# Patient Record
Sex: Male | Born: 1945 | Race: White | Hispanic: No | Smoking: Never smoker
Health system: Southern US, Community
[De-identification: ages and names within clinical notes are randomized; demographics above are authoritative.]

## PROBLEM LIST (undated history)

## (undated) DIAGNOSIS — I509 Heart failure, unspecified: Secondary | ICD-10-CM

## (undated) DIAGNOSIS — I10 Essential (primary) hypertension: Secondary | ICD-10-CM

## (undated) DIAGNOSIS — E785 Hyperlipidemia, unspecified: Secondary | ICD-10-CM

## (undated) DIAGNOSIS — I429 Cardiomyopathy, unspecified: Secondary | ICD-10-CM

## (undated) HISTORY — PX: OTHER SURGICAL HISTORY: SHX169

---

## 1999-07-02 ENCOUNTER — Ambulatory Visit (HOSPITAL_COMMUNITY): Admission: RE | Admit: 1999-07-02 | Discharge: 1999-07-02 | Payer: Self-pay | Admitting: Internal Medicine

## 2013-05-20 ENCOUNTER — Ambulatory Visit: Payer: Self-pay

## 2014-12-31 ENCOUNTER — Inpatient Hospital Stay: Payer: Self-pay | Admitting: Internal Medicine

## 2014-12-31 LAB — CBC
HCT: 51.7 % (ref 40.0–52.0)
HGB: 17.7 g/dL (ref 13.0–18.0)
MCH: 32.4 pg (ref 26.0–34.0)
MCHC: 34.2 g/dL (ref 32.0–36.0)
MCV: 95 fL (ref 80–100)
PLATELETS: 277 10*3/uL (ref 150–440)
RBC: 5.45 10*6/uL (ref 4.40–5.90)
RDW: 12.9 % (ref 11.5–14.5)
WBC: 12.6 10*3/uL — ABNORMAL HIGH (ref 3.8–10.6)

## 2014-12-31 LAB — COMPREHENSIVE METABOLIC PANEL
Albumin: 4.2 g/dL (ref 3.4–5.0)
Alkaline Phosphatase: 85 U/L (ref 46–116)
Anion Gap: 8 (ref 7–16)
BUN: 16 mg/dL (ref 7–18)
Bilirubin,Total: 0.4 mg/dL (ref 0.2–1.0)
Calcium, Total: 9 mg/dL (ref 8.5–10.1)
Chloride: 103 mmol/L (ref 98–107)
Co2: 26 mmol/L (ref 21–32)
Creatinine: 1.24 mg/dL (ref 0.60–1.30)
EGFR (African American): >60
Glucose: 102 mg/dL — ABNORMAL HIGH (ref 65–99)
Osmolality: 275 (ref 275–301)
Potassium: 3.9 mmol/L (ref 3.5–5.1)
SGOT(AST): 41 U/L — ABNORMAL HIGH (ref 15–37)
SGPT (ALT): 46 U/L (ref 14–63)
Sodium: 137 mmol/L (ref 136–145)
TOTAL PROTEIN: 8.1 g/dL (ref 6.4–8.2)

## 2014-12-31 LAB — URINALYSIS, COMPLETE
BACTERIA: NONE SEEN
BILIRUBIN, UR: NEGATIVE
BLOOD: NEGATIVE
GLUCOSE, UR: NEGATIVE mg/dL (ref 0–75)
Hyaline Cast: 9
Ketone: NEGATIVE
Leukocyte Esterase: NEGATIVE
Nitrite: NEGATIVE
Ph: 5 (ref 4.5–8.0)
Protein: 30
RBC,UR: <1 /HPF (ref 0–5)
SQUAMOUS EPITHELIAL: NONE SEEN
Specific Gravity: 1.017 (ref 1.003–1.030)
WBC UR: NONE SEEN /HPF (ref 0–5)

## 2014-12-31 LAB — CK-MB: CK-MB: 15.5 ng/mL — ABNORMAL HIGH (ref 0.5–3.6)

## 2014-12-31 LAB — CK TOTAL AND CKMB (NOT AT ARMC)
CK, Total: 125 U/L (ref 39–308)
CK-MB: 2.1 ng/mL (ref 0.5–3.6)

## 2014-12-31 LAB — TSH: Thyroid Stimulating Horm: 5.21 u[IU]/mL — ABNORMAL HIGH

## 2014-12-31 LAB — TROPONIN I
TROPONIN-I: 2.6 ng/mL — AB
Troponin-I: <0.02 ng/mL

## 2014-12-31 LAB — MAGNESIUM: Magnesium: 2.1 mg/dL

## 2015-01-01 DIAGNOSIS — I499 Cardiac arrhythmia, unspecified: Secondary | ICD-10-CM | POA: Diagnosis not present

## 2015-01-01 DIAGNOSIS — I509 Heart failure, unspecified: Secondary | ICD-10-CM | POA: Diagnosis present

## 2015-01-01 LAB — BASIC METABOLIC PANEL
Anion Gap: 8 (ref 7–16)
BUN: 13 mg/dL (ref 7–18)
CREATININE: 1.02 mg/dL (ref 0.60–1.30)
Calcium, Total: 7.6 mg/dL — ABNORMAL LOW (ref 8.5–10.1)
Chloride: 112 mmol/L — ABNORMAL HIGH (ref 98–107)
Co2: 23 mmol/L (ref 21–32)
EGFR (African American): >60
EGFR (Non-African Amer.): >60
Glucose: 114 mg/dL — ABNORMAL HIGH (ref 65–99)
Osmolality: 286 (ref 275–301)
Potassium: 3.8 mmol/L (ref 3.5–5.1)
Sodium: 143 mmol/L (ref 136–145)

## 2015-01-01 LAB — TROPONIN I
TROPONIN-I: 3.6 ng/mL — AB
Troponin-I: 3 ng/mL — ABNORMAL HIGH

## 2015-01-01 LAB — LIPID PANEL
Cholesterol: 165 mg/dL (ref 0–200)
HDL: 25 mg/dL — AB (ref 40–60)
Ldl Cholesterol, Calc: 114 mg/dL — ABNORMAL HIGH (ref 0–100)
TRIGLYCERIDES: 132 mg/dL (ref 0–200)
VLDL CHOLESTEROL, CALC: 26 mg/dL (ref 5–40)

## 2015-01-01 LAB — MAGNESIUM: Magnesium: 2 mg/dL

## 2015-01-01 LAB — CK-MB
CK-MB: 16.3 ng/mL — ABNORMAL HIGH (ref 0.5–3.6)
CK-MB: 15 ng/mL — ABNORMAL HIGH (ref 0.5–3.6)

## 2015-01-01 LAB — T4, FREE: FREE THYROXINE: 1.11 ng/dL (ref 0.76–1.46)

## 2015-01-02 ENCOUNTER — Ambulatory Visit (HOSPITAL_COMMUNITY)
Admission: AD | Admit: 2015-01-02 | Discharge: 2015-01-02 | Disposition: A | Discharge status: Home / Self Care | Payer: Medicare Other | Source: Other Acute Inpatient Hospital | Attending: Cardiovascular Disease | Admitting: Cardiovascular Disease

## 2015-01-02 DIAGNOSIS — I499 Cardiac arrhythmia, unspecified: Secondary | ICD-10-CM | POA: Insufficient documentation

## 2015-01-02 DIAGNOSIS — I509 Heart failure, unspecified: Secondary | ICD-10-CM | POA: Insufficient documentation

## 2015-03-04 ENCOUNTER — Emergency Department
Admit: 2015-03-04 | Disposition: A | Discharge status: Home / Self Care | Payer: Self-pay | Admitting: Emergency Medicine

## 2015-03-04 LAB — BASIC METABOLIC PANEL
Anion Gap: 8 (ref 7–16)
BUN: 16 mg/dL
CALCIUM: 9.1 mg/dL
CO2: 26 mmol/L
CREATININE: 1.26 mg/dL — AB
Chloride: 102 mmol/L
GFR CALC NON AF AMER: 58 — AB
GLUCOSE: 119 mg/dL — AB
Potassium: 3.8 mmol/L
SODIUM: 136 mmol/L

## 2015-03-04 LAB — CBC WITH DIFFERENTIAL/PLATELET
Basophil #: 0.1 10*3/uL (ref 0.0–0.1)
Basophil %: 0.8 %
EOS PCT: 1.5 %
Eosinophil #: 0.2 10*3/uL (ref 0.0–0.7)
HCT: 45 % (ref 40.0–52.0)
HGB: 15 g/dL (ref 13.0–18.0)
Lymphocyte #: 2.3 10*3/uL (ref 1.0–3.6)
Lymphocyte %: 21.3 %
MCH: 31.3 pg (ref 26.0–34.0)
MCHC: 33.3 g/dL (ref 32.0–36.0)
MCV: 94 fL (ref 80–100)
MONO ABS: 1.1 x10 3/mm — AB (ref 0.2–1.0)
Monocyte %: 10.1 %
NEUTROS PCT: 66.3 %
Neutrophil #: 7 10*3/uL — ABNORMAL HIGH (ref 1.4–6.5)
PLATELETS: 189 10*3/uL (ref 150–440)
RBC: 4.79 10*6/uL (ref 4.40–5.90)
RDW: 13.1 % (ref 11.5–14.5)
WBC: 10.6 10*3/uL (ref 3.8–10.6)

## 2015-03-04 LAB — TROPONIN I: Troponin-I: <0.03 ng/mL

## 2015-04-01 NOTE — Consult Note (Signed)
   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 He has ruled in for mi with troponin of greater than 3. He has been dong well prior to this. He was on metoprolol 100 mg tartrate bid. He is also on lisinopril. He is currently in nsr with no further arryhtmia   Physical Exam:  GEN no acute distress   HEENT PERRL   NECK No masses   RESP clear BS   CARD Regular rate and rhythm   ABD denies tenderness  no Adominal Mass   LYMPH negative neck, negative axillae   EXTR negative cyanosis/clubbing, negative edema   SKIN normal to palpation   NEURO cranial nerves intact, motor/sensory function intact   PSYCH A+O to time, place, person   Review of Systems:  Subjective/Chief Complaint palpiations yesterday but asymptomatic today   General: No Complaints   Skin: No Complaints   ENT: No Complaints   Eyes: No Complaints   Neck: No Complaints   Respiratory: No Complaints   Cardiovascular: chest wall pain   Gastrointestinal: No Complaints   Genitourinary: No Complaints   Vascular: No Complaints   Musculoskeletal: No Complaints   Neurologic: No Complaints   Hematologic: No Complaints   Endocrine: No Complaints   Psychiatric: No Complaints   Review of Systems: All other systems were reviewed and found to be negative   Medications/Allergies Reviewed Medications/Allergies reviewed   Family & Social History:  Family and Social History:  Family History Non-Contributory   EKG:  EKG NSR    No Known Allergies:    Impression 69 yo male with 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 Ruled in for nonstemi. Troponin may be seocndary to cardioversion anxd vt vs  progression of cad. Will need to start on amiodarone and consider cardiac cath to evaluate anatomy to rule out ischemia as etiology of vt. Will need reconsideration for aicd.   Plan 1. Loads with iv amiodarone 2. Rule ouit for mi 3. Consider cardiac cath 4. Will need consideration for aicd 5. Will discuss with his primary cardiologist.   Electronic Signatures: Dalia Heading (MD)  (Signed 01-Feb-16 07:45)  Authored: General Aspect/Present Illness, History and Physical Exam, Review of System, Family & Social History, EKG , Allergies, Impression/Plan   Last Updated: 01-Feb-16 07:45 by Dalia Heading (MD)

## 2015-04-01 NOTE — H&P (Signed)
PATIENT NAME:  Corey Zhang, Corey Zhang MR#:  [id number] DATE OF BIRTH:  02/09/1946  DATE OF ADMISSION:  12/31/2014  PRIMARY CARE PHYSICIAN: Stann Mainland. Sampson Goon, MD   CHIEF COMPLAINT: Chest pain and palpitations today.  HISTORY OF PRESENT ILLNESS: A 69 year old Caucasian male with a history of cardiomyopathy with ejection fraction 25% present the ED with the above chief complaint. The patient is alert, awake, oriented, in no acute distress. The patient said that he was in church and started to have chest pain and palpitation, then he became confused for a short period of time and was sent to the ED for further evaluation. The patient also complains of mild shortness of breath and nausea. The patient has had chest pressure about 2 weeks on and off. The patient was noticed to have tachycardia, was treated with adenosine 3 times (6mg , 6mg , 12mg ). But the patient still had tachycardia. Dr. Scotty Court gave 2 doses of Cardizem IV. Tachycardia did not resolved, but blood pressure decreased to 87/40, so Dr. Scotty Court shocked the patient with 200 joules. The patient's heart rate decreased to 60 to 70s. Dr. Scotty Court ordered a Cardizem drip. The patient denies any chest pain after cardioversion. He denies any cough, sputum, or shortness of breath. No orthopnea or nocturnal dyspnea. No leg edema.   PAST MEDICAL HISTORY: Cardiomyopathy with ejection fraction 25%.   PAST SURGICAL HISTORY: Cardiac stent.    SOCIAL HISTORY: No smoking or drinking or illicit drugs.   FAMILY HISTORY: Hypertension and heart disease.   ALLERGIES: None.   HOME MEDICATIONS: Vitamin B12 at 1000 mcg p.o. daily, red yeast rice 600 mg p.o. daily, multivitamin 1 tablet p.o. daily, Lopressor 100 mg p.o. daily, lisinopril 5 mg p.o. daily, aspirin 325 mg p.o. daily.   REVIEW OF SYSTEMS: CONSTITUTIONAL: The patient denies any fever or chills. No headache, but has dizziness and weakness.  EYES: No double vision or blurry vision.  EARS, NOSE, AND THROAT: No  postnasal drip, slurred speech or dysphagia.  CARDIOVASCULAR: Positive for chest pain, palpitation. No orthopnea or nocturnal dyspnea. No leg edema.  PULMONARY: No cough, sputum, but has mild shortness of breath. No hemoptysis. No wheezing.  GASTROINTESTINAL: No abdominal pain, nausea, vomiting, diarrhea. No melena or bloody stool.  GENITOURINARY: No dysuria, hematuria, or incontinence.  SKIN: No rash or jaundice.  NEUROLOGY: No syncope, loss of consciousness, or seizure, but has temporary confusion. HEPATOLOGY: No easy bruising or bleeding.  ENDOCRINOLOGY: No polyuria, polydipsia, heat or cold intolerance.   PHYSICAL EXAMINATION:  VITAL SIGNS: Temperature 98.3, blood pressure 117/75, pulse 67, oxygen saturation 96% on room air.  GENERAL: The patient is alert, awake, oriented, in no acute distress.  HEENT: Pupils round, equal and reactive to light and accommodation. Moist oral mucosa. Clear oropharynx.  NECK: Supple. No JVD or carotid bruits. No lymphadenopathy. No thyromegaly.  CARDIOVASCULAR: S1 and S2, regular rate, rhythm. No murmurs or gallops.  PULMONARY: Bilateral air entry. No wheezing or rales. No use of accessory muscles to breathe.  ABDOMEN: Soft, obese. No distention or tenderness. No organomegaly. Bowel sounds present.  EXTREMITIES: No edema, clubbing or cyanosis. No calf tenderness. Bilateral pedal pulses present.  SKIN: No rash or jaundice.  NEUROLOGY: A and O x 3. No focal deficit. Power 5/5. Sensory intact.   LABORATORY DATA: Chest x-ray did not show any edema or consolidation. Glucose 102, BUN 6, creatinine 1.24. Electrolytes normal. CBC showed WBC 12.6, hemoglobin 17.7, platelets 277,000. TSH 5.21. Troponin less than 0.02. Urinalysis is negative. EKG initially showed  wide QRS, tachycardia at 221; after cardioversion it decreased to 67 with a mixed arrhythmia.   IMPRESSIONS:  1.  Ventricular tachycardia.  2.  Hypotension.  3.  Cardiomyopathy with ejection fraction 25%.    PLAN OF TREATMENT: The patient will be admitted to step-down unit. Closely monitor the patient's vital signs on the telemetry monitor and follow up troponin. Discussed with Dr. Lady Gary. He suggested no Cardizem drip at this time and continue the patient's home medication Lopressor 100 mg p.o. daily. In addition, he agrees to start Lovenox 1 mg/kg q. 12 hours. He will see the patient. I will continue aspirin and lisinopril. I discussed the patient's condition and plan of treatment with the patient. The patient wants DNR status, discussed with Dr. Lady Gary.   CRITICAL TIME SPENT: About 62 minutes.    ____________________________ Shaune Pollack, MD qc:TT D: 12/31/2014 20:50:06 ET T: 12/31/2014 21:27:26 ET JOB#: [id number]  cc: Shaune Pollack, MD, <Dictator> Shaune Pollack MD ELECTRONICALLY SIGNED 01/01/2015 15:30

## 2015-04-01 NOTE — Discharge Summary (Signed)
PATIENT NAME:  Corey Zhang, Corey Zhang MR#:  [id number] DATE OF BIRTH:  [birth year]  DATE OF ADMISSION:  12/31/2014 DATE OF DISCHARGE:  01/01/2015  DISCHARGE DIAGNOSES: 1.  Ventricular tachycardia.  2.  Hypotension.  3. Cardiomyopathy with ejection fraction of 25% requiring automatic implantable cardiac defibrillator.   REASON FOR TRANSFER:  Need for automatic implantable cardiac defibrillator for severe cardiomyopathy.   SECONDARY DIAGNOSIS:  Cardiomyopathy.   CONSULTATION: Cardiology, Darlin Priestly. Lady Gary, MD  PROCEDURES AND RADIOLOGY: Cardiac catheterization on February 1 showed no significant coronary disease. EF was 20%.   Chest x-ray showed no acute cardiopulmonary disease.   MAJOR LABORATORY PANEL: UA on admission was negative.   HISTORY AND SHORT HOSPITAL COURSE: The patient is a 68 year old male with above-mentioned medical problems who was admitted for chest pain and palpitation and was found to have ventricular tachycardia in the Emergency Department. Was shocked and started on Cardizem drip.  Subsequently, cardiology consultation was obtained. The patient was admitted to ICU. The patient was switched to amiodarone drip, rate was much better controlled. The patient was taken for cardiac catheterization this morning showing no significant coronary disease, but considering severe cardiomyopathy with EF of 20%, he is being transferred to [facility] for AICD placement.   MEDICATIONS WHILE IN THE HOSPITAL:  1.  Amiodarone drip. 2.  Tylenol 650 mg p.o. every 4 hours as needed.  3.  Norco 5/325 mg 1-2 tablets every 4 hours as needed.  4.  Aspirin 325 mg p.o. daily.  5.  Colace 100 mg p.o. b.i.d. as needed.  6.  Lovenox 75 mg subcutaneous b.i.d.  7.  Lisinopril 5 mg p.o. daily.  8.  Zofran 4 mg IV every 4 hours as needed.  9.  Senna 1 tablet p.o. b.i.d.  10.  Fentanyl 50 mcg IV every 1-2 minutes as needed.  11.  Metoprolol 50 mg p.o. daily.   TOTAL TIME DISCHARGING THIS  PATIENT: 45 minutes (critical care).   The patient will need to go on amiodarone drip on the transfer ambulance.    ____________________________ Cherysh Epperly S. Sherryll Burger, MD vss:LT D: 01/01/2015 17:05:57 ET T: 01/01/2015 17:25:07 ET JOB#: [id number]  cc: Velna Hedgecock S. Sherryll Burger, MD, <Dictator> Darlin Priestly. Lady Gary, MD Stann Mainland. Sampson Goon, MD Rise Paganini. Al-Khatib, MD, [facility] [place] Sia [facility] MD ELECTRONICALLY SIGNED 01/02/2015 10:09

## 2016-01-05 IMAGING — CR DG CHEST 1V PORT
1 series · 1 of 1 positions shown · non-contrast
Comparison: None.

CLINICAL DATA: Sternal region chest pain, acute

EXAM:
PORTABLE CHEST - 1 VIEW

[ap]
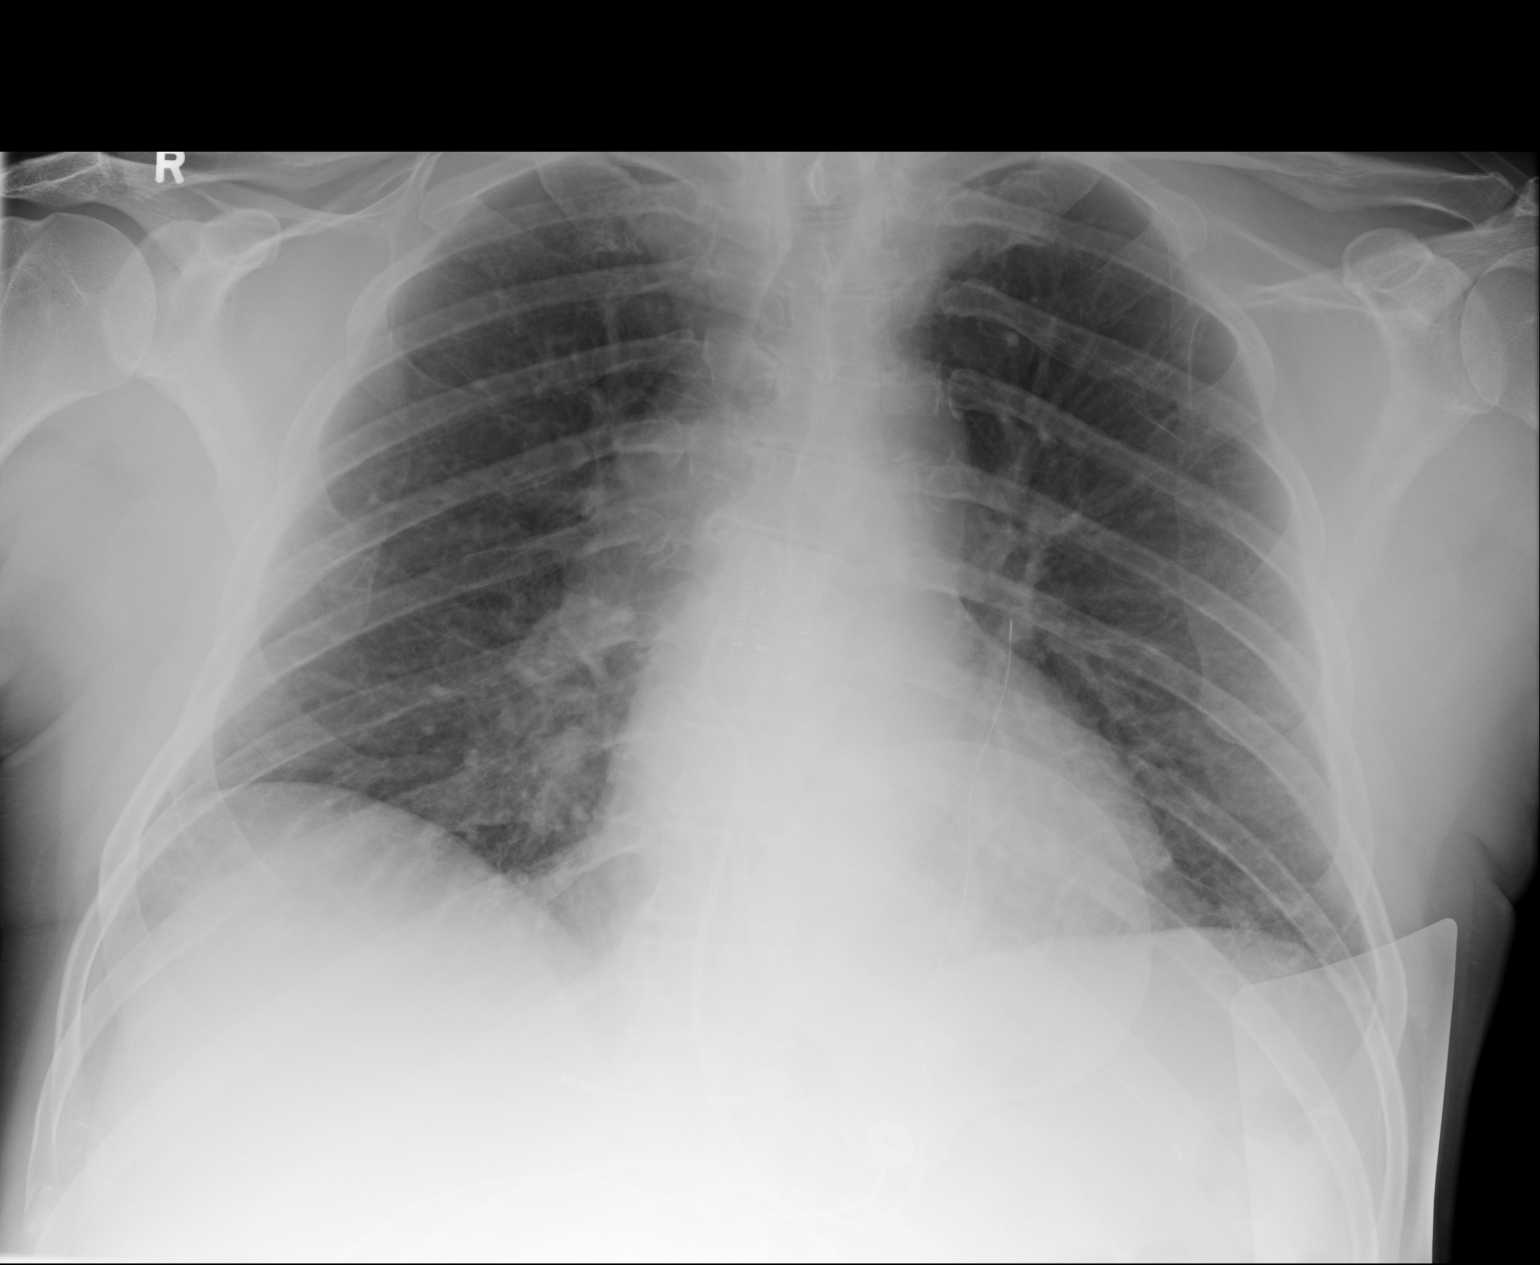

[1 of 1 positions shown; findings below may reference images not displayed]

FINDINGS: The lungs are clear. Heart size and pulmonary vascularity are
normal. No adenopathy. No pneumothorax. No bone lesions. There is
upper thoracic levoscoliosis.
IMPRESSION: No edema or consolidation.

## 2016-03-08 IMAGING — CT CT ANGIO HEAD-NECK
2 of 8 series · 8 of 33 positions shown · IV contrast (omnipaque)
Comparison: Head CT ear earlier same day

CLINICAL DATA: Vertigo with standing. Vertebrobasilar
insufficiency.

EXAM:
CT ANGIOGRAPHY HEAD AND NECK
TECHNIQUE: Multidetector CT imaging of the head and neck was performed using
the standard protocol during bolus administration of intravenous
contrast. Multiplanar CT image reconstructions and MIPs were
obtained to evaluate the vascular anatomy. Carotid stenosis
measurements (when applicable) are obtained utilizing NASCET
criteria, using the distal internal carotid diameter as the
denominator.
CONTRAST:  80 cc Omnipaque 350

[Series 4: cta head · axial · 0.46mm/px · z∈[-278,-76]mm · 4 of 338 slices shown]
[im 68/338  soft-tissue]
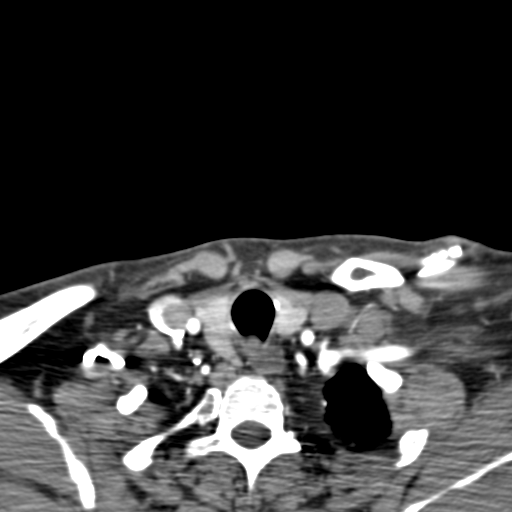
[im 135/338  bone]
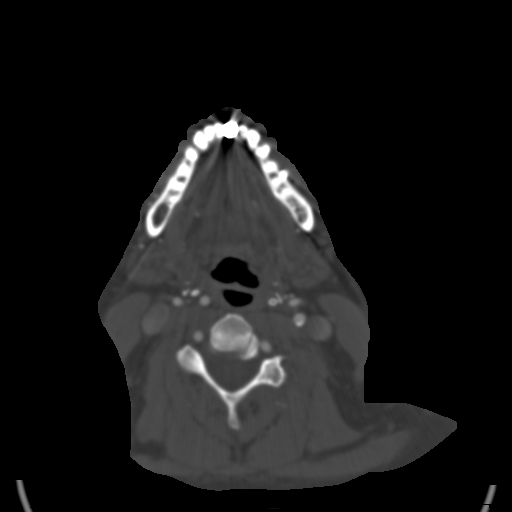
[im 203/338  soft-tissue]
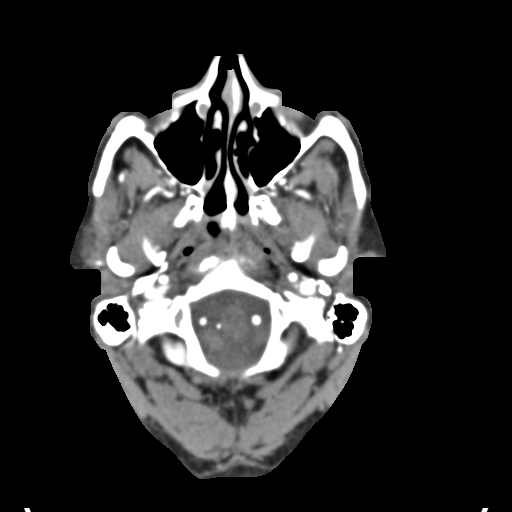
[im 270/338  bone]
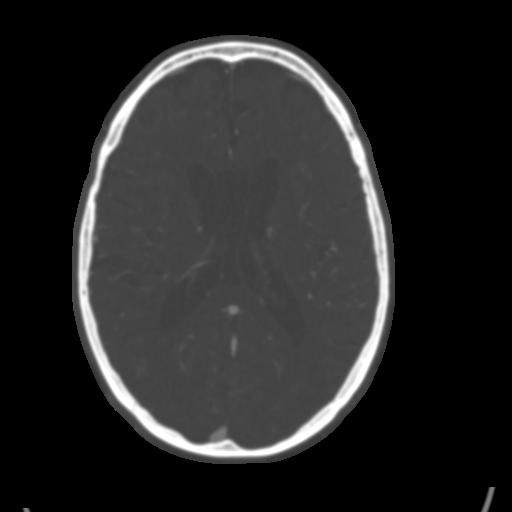

[Series 5: ax thin · axial · 0.36mm/px · z∈[-287,-87]mm · 4 of 336 slices shown]
[im 68/336  soft-tissue]
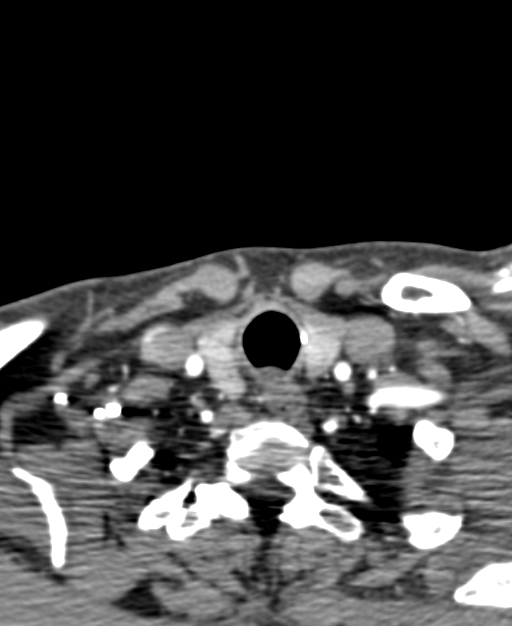
[im 135/336  soft-tissue]
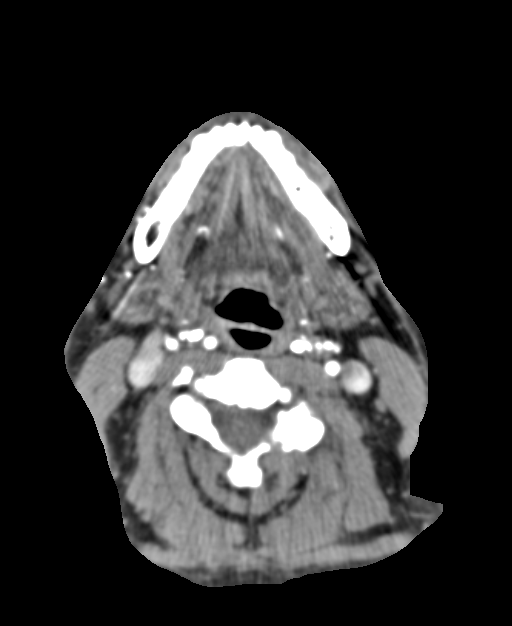
[im 202/336  soft-tissue]
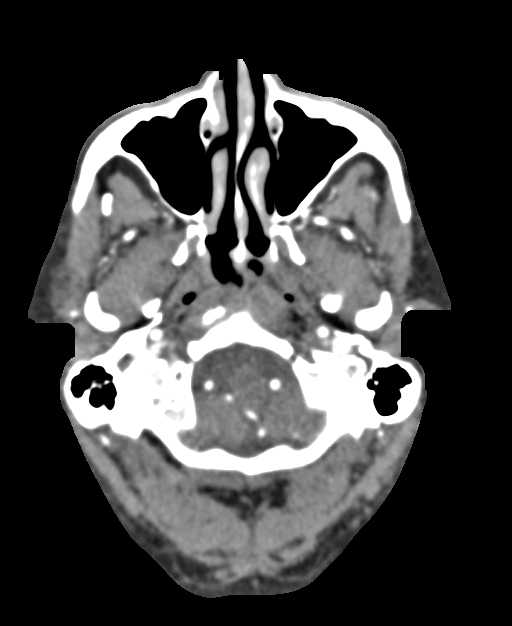
[im 269/336  soft-tissue]
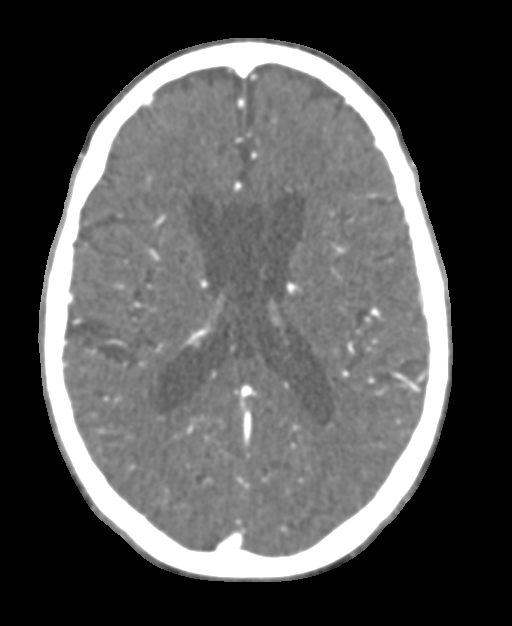

[8 of 33 positions shown; findings below may reference images not displayed]

FINDINGS: CTA NECK

Aortic arch: Mild atherosclerosis of the arch without aneurysm or
dissection. Branching pattern of brachiocephalic vessels is normal
with wide patency at the origins.

Right carotid system: Right common carotid artery is widely patent
to the bifurcation. There is atherosclerosis of the carotid
bifurcation but no narrowing of the internal carotid artery compared
to the more distal cervical internal carotid artery and therefore no
stenosis.

Left carotid system: Common carotid artery widely patent to the
bifurcation region. Mild atherosclerosis of the carotid bifurcation.
Minimal ICA diameter is 3.3 mm. Compared to a more distal cervical
ICA diameter of 4 mm, this indicates 20% stenosis.

Vertebral arteries:Both vertebral artery origins are widely patent.
Both vertebral arteries are widely patent through the cervical
region without stenosis or dissection.

Skeleton: Ordinary mid cervical spondylosis.

Other neck: No mass or lymphadenopathy.

CTA HEAD

Anterior circulation: Both internal carotid arteries are widely
patent through the skullbase and siphon region. The anterior and
middle cerebral vessels are patent without proximal stenosis,
aneurysm or vascular malformation.

Posterior circulation: Both vertebral arteries are widely patent
through the foramen magnum. Both vertebral arteries supply the
basilar. No basilar stenosis. Posterior circulation branch vessels
are normal.

Venous sinuses: Normal and patent.

Anatomic variants: None

Delayed phase: No significant finding
IMPRESSION: Essentially normal CT angiography of the neck and head for a person
of this age. Mild atherosclerotic change at both carotid
bifurcations. No stenosis on the right. 20% stenosis on the left.

No vertebrobasilar vascular pathology.

## 2020-05-11 ENCOUNTER — Emergency Department
Admission: EM | Admit: 2020-05-11 | Discharge: 2020-05-11 | Disposition: A | Discharge status: Home / Self Care | Payer: Medicare Other | Attending: Student | Admitting: Student

## 2020-05-11 ENCOUNTER — Other Ambulatory Visit: Payer: Self-pay

## 2020-05-11 DIAGNOSIS — I110 Hypertensive heart disease with heart failure: Secondary | ICD-10-CM | POA: Diagnosis not present

## 2020-05-11 DIAGNOSIS — I509 Heart failure, unspecified: Secondary | ICD-10-CM | POA: Insufficient documentation

## 2020-05-11 DIAGNOSIS — Z7982 Long term (current) use of aspirin: Secondary | ICD-10-CM | POA: Diagnosis not present

## 2020-05-11 DIAGNOSIS — Z79899 Other long term (current) drug therapy: Secondary | ICD-10-CM | POA: Diagnosis not present

## 2020-05-11 DIAGNOSIS — R040 Epistaxis: Secondary | ICD-10-CM | POA: Diagnosis not present

## 2020-05-11 DIAGNOSIS — Z95811 Presence of heart assist device: Secondary | ICD-10-CM | POA: Insufficient documentation

## 2020-05-11 HISTORY — DX: Hyperlipidemia, unspecified: E78.5

## 2020-05-11 HISTORY — DX: Heart failure, unspecified: I50.9

## 2020-05-11 HISTORY — DX: Essential (primary) hypertension: I10

## 2020-05-11 HISTORY — DX: Cardiomyopathy, unspecified: I42.9

## 2020-05-11 LAB — BASIC METABOLIC PANEL
Anion gap: 9 (ref 5–15)
BUN: 14 mg/dL (ref 8–23)
CO2: 22 mmol/L (ref 22–32)
Calcium: 8.8 mg/dL — ABNORMAL LOW (ref 8.9–10.3)
Chloride: 106 mmol/L (ref 98–111)
Creatinine, Ser: 1.06 mg/dL (ref 0.61–1.24)
GFR calc Af Amer: >60 mL/min (ref >60)
GFR calc non Af Amer: >60 mL/min (ref >60)
Glucose, Bld: 99 mg/dL (ref 70–99)
Potassium: 4.5 mmol/L (ref 3.5–5.1)
Sodium: 137 mmol/L (ref 135–145)

## 2020-05-11 LAB — CBC
HCT: 45.5 % (ref 39.0–52.0)
Hemoglobin: 15.5 g/dL (ref 13.0–17.0)
MCH: 31.9 pg (ref 26.0–34.0)
MCHC: 34.1 g/dL (ref 30.0–36.0)
MCV: 93.6 fL (ref 80.0–100.0)
Platelets: 144 10*3/uL — ABNORMAL LOW (ref 150–400)
RBC: 4.86 MIL/uL (ref 4.22–5.81)
RDW: 12.7 % (ref 11.5–15.5)
WBC: 9 10*3/uL (ref 4.0–10.5)
nRBC: 0 % (ref 0.0–0.2)

## 2020-05-11 NOTE — ED Provider Notes (Signed)
[facility] Emergency Department Provider Note  ____________________________________________   First MD Initiated Contact with Patient 05/11/20 [phone]     (approximate)  I have reviewed the triage vital signs and the nursing notes.  History  Chief Complaint Epistaxis    HPI Corey Zhang is a 74 y.o. male PMHx as below who presents for nose bleed.  Patient states he first developed a mild nosebleed around 3 AM.  This lasted for about 45 minutes before he was able to get it to stop.  Then around 6 AM he began to experience a nosebleed again, but this time he felt like it was worse than prior, more brisk.  He feels it coming from the left side of his nose.  He was unable to get this nosebleed to stop on his own, therefore called EMS.  Reports a history of about yearly nosebleeds, especially when his nose gets dry, as it has been recently.  Also thinks he might of scratched his nose with his finger.  He is on a baby aspirin daily, no other antiplatelets or antithinners.  By arrival to the emergency department his nosebleed has stopped.  Denies any pain.   Past Medical Hx Past Medical History:  Diagnosis Date  . Cardiomyopathy (HCC)   . CHF (congestive heart failure) (HCC)   . Hyperlipidemia   . Hypertension     Problem List There are no problems to display for this patient.   Past Surgical Hx Past Surgical History:  Procedure Laterality Date  . defribrillator implant      Medications Per chart review,  ASA 81 mg QD Lisinopril Melatonin Metoprolol Vitamin  Allergies Patient has no allergy information on record.  Family Hx No family history on file.  Social Hx Social History   Tobacco Use  . Smoking status: Not on file  Substance Use Topics  . Alcohol use: Not on file  . Drug use: Not on file     Review of Systems  Constitutional: Negative for fever. Negative for chills. Eyes: Negative for visual changes. ENT: +  nosebleed Cardiovascular: Negative for chest pain. Respiratory: Negative for shortness of breath. Gastrointestinal: Negative for nausea. Negative for vomiting.  Genitourinary: Negative for dysuria. Musculoskeletal: Negative for leg swelling. Skin: Negative for rash. Neurological: Negative for headaches.   Physical Exam  Vital Signs: ED Triage Vitals  Enc Vitals Group     BP 05/11/20 0723 (!) 125/92     Pulse Rate 05/11/20 0723 91     Resp 05/11/20 0723 18     Temp 05/11/20 0723 98.9 F (37.2 C)     Temp Source 05/11/20 0723 Oral     SpO2 05/11/20 0723 95 %     Weight --      Height --      Head Circumference --      Peak Flow --      Pain Score 05/11/20 0722 0     Pain Loc --      Pain Edu? --      Excl. in GC? --     Constitutional: Alert and oriented. Well appearing. NAD.  Head: Normocephalic. Atraumatic. Eyes: Conjunctivae clear. Sclera anicteric. Pupils equal and symmetric. Nose: Arrives with nose clamp in place.  Upon removal of the nose clamp, small amount of dried epistaxis visualized in bilateral naris.  No active bleeding.  No obvious vessel source identified. Mouth/Throat: Wearing mask.  No blood in the posterior oropharynx. Neck: No stridor. Trachea midline.  Cardiovascular: Normal rate. Extremities well perfused. Respiratory: Normal respiratory effort.   Genitourinary: Deferred. Musculoskeletal: No lower extremity edema. No deformities. Neurologic:  Normal speech and language. No gross focal or lateralizing neurologic deficits are appreciated.  Skin: Skin is warm, dry and intact. No rash noted. Psychiatric: Mood and affect are appropriate for situation.   Procedures  Procedure(s) performed (including critical care):  Procedures   Initial Impression / Assessment and Plan / MDM / ED Course  74 y.o. male who presents to the ED for recurrent episode of epistaxis.  Had an episode at home this morning around 3 AM which resolved on its own, then recurred  around 6 AM prompting him to seek care.  By arrival to the emergency department his nosebleed has again resolved.  Takes baby aspirin daily, no other thinners.  Exam as above.  We will plan to observe for at least 30 minutes to ensure continued cessation of his bleeding.  If remains stable, will plan for discharge with supportive care, discussed nasal precautions, and plan for ENT follow-up.  9:03 AM Patient has been observed for greater than 1 hour without recurrence in his epistaxis.  Basic blood work obtained in triage reveal normal hemoglobin, minimally decreased platelets.  As such, feel patient is stable for discharge with outpatient follow-up.  Discussed nasal precautions.  Advise ENT follow-up and given return precautions.  Patient voices understanding and is comfortable with the plan and discharge.  _______________________________   As part of my medical decision making I have reviewed available labs, radiology tests, reviewed old records/performed chart review.    Final Clinical Impression(s) / ED Diagnosis  Final diagnoses:  Epistaxis       Note:  This document was prepared using Dragon voice recognition software and may include unintentional dictation errors.   Lilia Pro., MD 05/11/20 [phone]

## 2020-05-11 NOTE — ED Triage Notes (Addendum)
Pt comes via [facility] from home with c/o nose bleed for 3 hours.  Pt comes with clamp in place with some bleeding. Pt states he takes daily aspirin.  Pt denies any pain or other complaints.   Pt does state nose bleeds in past but never this bad and unable to stop. Pt spitting up clots of blood at this time in triage.

## 2020-05-11 NOTE — ED Notes (Signed)
Pt with c/o of epistaxis. Nose clamp removed by MD and bleeding controlled at this time. Pt denies any pain or other symptoms.

## 2020-05-11 NOTE — ED Notes (Signed)
Pt verbalized understanding of discharge instructions. NAD at this time. 

## 2020-05-11 NOTE — Discharge Instructions (Signed)
Thank you for letting us take care of you in the ER today.  As best you can, for the next 48 hours, avoid anything in the nose, including fingers/Q-tips/etc.  Avoid blowing your nose, coughing, sneezing, clearing of your throat as this can dislodge the scab that has helped stop your bleeding.  After this, you can apply a small amount of Vaseline to your nose when it is feeling dry.  Follow-up with the ENT doctors in the clinic as needed.  Return to the emergency department for any new or worsening symptoms.

## 2020-08-07 ENCOUNTER — Inpatient Hospital Stay
Admission: EM | Admit: 2020-08-07 | Discharge: 2020-08-09 | DRG: 309 | Disposition: A | Discharge status: Other Acute Inpatient Hospital | Payer: Medicare Other | Attending: Internal Medicine | Admitting: Internal Medicine

## 2020-08-07 ENCOUNTER — Encounter: Payer: Self-pay | Admitting: Psychiatry

## 2020-08-07 ENCOUNTER — Other Ambulatory Visit: Payer: Self-pay

## 2020-08-07 DIAGNOSIS — I499 Cardiac arrhythmia, unspecified: Secondary | ICD-10-CM | POA: Diagnosis not present

## 2020-08-07 DIAGNOSIS — Z8249 Family history of ischemic heart disease and other diseases of the circulatory system: Secondary | ICD-10-CM

## 2020-08-07 DIAGNOSIS — I420 Dilated cardiomyopathy: Secondary | ICD-10-CM | POA: Diagnosis present

## 2020-08-07 DIAGNOSIS — Z823 Family history of stroke: Secondary | ICD-10-CM | POA: Diagnosis not present

## 2020-08-07 DIAGNOSIS — Z7982 Long term (current) use of aspirin: Secondary | ICD-10-CM

## 2020-08-07 DIAGNOSIS — I110 Hypertensive heart disease with heart failure: Secondary | ICD-10-CM | POA: Diagnosis present

## 2020-08-07 DIAGNOSIS — E785 Hyperlipidemia, unspecified: Secondary | ICD-10-CM | POA: Diagnosis present

## 2020-08-07 DIAGNOSIS — Z808 Family history of malignant neoplasm of other organs or systems: Secondary | ICD-10-CM | POA: Diagnosis not present

## 2020-08-07 DIAGNOSIS — Z20822 Contact with and (suspected) exposure to covid-19: Secondary | ICD-10-CM | POA: Diagnosis present

## 2020-08-07 DIAGNOSIS — Z79899 Other long term (current) drug therapy: Secondary | ICD-10-CM

## 2020-08-07 DIAGNOSIS — I4901 Ventricular fibrillation: Principal | ICD-10-CM

## 2020-08-07 DIAGNOSIS — I472 Ventricular tachycardia: Secondary | ICD-10-CM | POA: Diagnosis present

## 2020-08-07 DIAGNOSIS — Z8673 Personal history of transient ischemic attack (TIA), and cerebral infarction without residual deficits: Secondary | ICD-10-CM

## 2020-08-07 DIAGNOSIS — Z8349 Family history of other endocrine, nutritional and metabolic diseases: Secondary | ICD-10-CM

## 2020-08-07 DIAGNOSIS — I5022 Chronic systolic (congestive) heart failure: Secondary | ICD-10-CM | POA: Diagnosis present

## 2020-08-07 DIAGNOSIS — Z95810 Presence of automatic (implantable) cardiac defibrillator: Secondary | ICD-10-CM | POA: Diagnosis not present

## 2020-08-07 DIAGNOSIS — Z83438 Family history of other disorder of lipoprotein metabolism and other lipidemia: Secondary | ICD-10-CM | POA: Diagnosis not present

## 2020-08-07 DIAGNOSIS — Z888 Allergy status to other drugs, medicaments and biological substances status: Secondary | ICD-10-CM

## 2020-08-07 LAB — CBC
HCT: 45.6 % (ref 39.0–52.0)
Hemoglobin: 16 g/dL (ref 13.0–17.0)
MCH: 33 pg (ref 26.0–34.0)
MCHC: 35.1 g/dL (ref 30.0–36.0)
MCV: 94 fL (ref 80.0–100.0)
Platelets: 191 10*3/uL (ref 150–400)
RBC: 4.85 MIL/uL (ref 4.22–5.81)
RDW: 12.5 % (ref 11.5–15.5)
WBC: 12.6 10*3/uL — ABNORMAL HIGH (ref 4.0–10.5)
nRBC: 0 % (ref 0.0–0.2)

## 2020-08-07 LAB — SARS CORONAVIRUS 2 BY RT PCR (HOSPITAL ORDER, PERFORMED IN ~~LOC~~ HOSPITAL LAB): SARS Coronavirus 2: NEGATIVE

## 2020-08-07 LAB — BASIC METABOLIC PANEL
Anion gap: 12 (ref 5–15)
BUN: 12 mg/dL (ref 8–23)
CO2: 23 mmol/L (ref 22–32)
Calcium: 9.2 mg/dL (ref 8.9–10.3)
Chloride: 103 mmol/L (ref 98–111)
Creatinine, Ser: 0.96 mg/dL (ref 0.61–1.24)
GFR calc Af Amer: >60 mL/min (ref >60)
GFR calc non Af Amer: >60 mL/min (ref >60)
Glucose, Bld: 95 mg/dL (ref 70–99)
Potassium: 4.2 mmol/L (ref 3.5–5.1)
Sodium: 138 mmol/L (ref 135–145)

## 2020-08-07 LAB — TROPONIN I (HIGH SENSITIVITY)
Troponin I (High Sensitivity): 154 ng/L (ref <18)
Troponin I (High Sensitivity): 249 ng/L (ref <18)
Troponin I (High Sensitivity): 222 ng/L (ref <18)

## 2020-08-07 LAB — PROTIME-INR
INR: 1 (ref 0.8–1.2)
Prothrombin Time: 12.4 seconds (ref 11.4–15.2)

## 2020-08-07 LAB — MAGNESIUM: Magnesium: 2.2 mg/dL (ref 1.7–2.4)

## 2020-08-07 MED ORDER — ENOXAPARIN SODIUM 40 MG/0.4ML ~~LOC~~ SOLN
40.0000 mg | SUBCUTANEOUS | Status: DC
  Administered 2020-08-07 – 2020-08-08 (×2): 40 mg via SUBCUTANEOUS
  Filled 2020-08-07 (×2): qty 0.4

## 2020-08-07 MED ORDER — ALUM & MAG HYDROXIDE-SIMETH 200-200-20 MG/5ML PO SUSP: 15.0000 mL | Freq: Four times a day (QID) | ORAL | Status: DC | PRN

## 2020-08-07 MED ORDER — MELATONIN 5 MG PO TABS
5.0000 mg | ORAL_TABLET | Freq: Every evening | ORAL | Status: DC | PRN
  Filled 2020-08-07: qty 1

## 2020-08-07 MED ORDER — CALCIUM CARBONATE ANTACID 500 MG PO CHEW: 1.0000 | CHEWABLE_TABLET | Freq: Three times a day (TID) | ORAL | Status: DC | PRN

## 2020-08-07 MED ORDER — ONDANSETRON 4 MG PO TBDP
4.0000 mg | ORAL_TABLET | Freq: Three times a day (TID) | ORAL | Status: DC | PRN
  Filled 2020-08-07: qty 1

## 2020-08-07 MED ORDER — ACETAMINOPHEN 500 MG PO TABS: 1000.0000 mg | ORAL_TABLET | Freq: Three times a day (TID) | ORAL | Status: DC | PRN

## 2020-08-07 MED ORDER — DOCUSATE SODIUM 100 MG PO CAPS: 100.0000 mg | ORAL_CAPSULE | Freq: Two times a day (BID) | ORAL | Status: DC | PRN

## 2020-08-07 MED ORDER — LISINOPRIL 5 MG PO TABS
5.0000 mg | ORAL_TABLET | Freq: Every day | ORAL | Status: DC
  Administered 2020-08-08 – 2020-08-09 (×2): 5 mg via ORAL
  Filled 2020-08-07 (×2): qty 1

## 2020-08-07 MED ORDER — POLYETHYLENE GLYCOL 3350 17 G PO PACK: 17.0000 g | PACK | Freq: Two times a day (BID) | ORAL | Status: DC | PRN

## 2020-08-07 MED ORDER — METOPROLOL SUCCINATE ER 100 MG PO TB24
100.0000 mg | ORAL_TABLET | Freq: Two times a day (BID) | ORAL | Status: DC
  Administered 2020-08-07 – 2020-08-09 (×4): 100 mg via ORAL
  Filled 2020-08-07 (×2): qty 1
  Filled 2020-08-07: qty 2
  Filled 2020-08-07: qty 1

## 2020-08-07 MED ORDER — ASPIRIN EC 81 MG PO TBEC
81.0000 mg | DELAYED_RELEASE_TABLET | Freq: Every day | ORAL | Status: DC
  Administered 2020-08-08 – 2020-08-09 (×2): 81 mg via ORAL
  Filled 2020-08-07 (×3): qty 1

## 2020-08-07 MED ORDER — GUAIFENESIN-DM 100-10 MG/5ML PO SYRP
10.0000 mL | ORAL_SOLUTION | Freq: Four times a day (QID) | ORAL | Status: DC | PRN
  Filled 2020-08-07: qty 10

## 2020-08-07 MED ORDER — ONDANSETRON HCL 4 MG/2ML IJ SOLN: 4.0000 mg | Freq: Four times a day (QID) | INTRAMUSCULAR | Status: DC | PRN

## 2020-08-07 NOTE — ED Notes (Signed)
Pt stated he has urinated 3 times and had 1BM since being here.

## 2020-08-07 NOTE — ED Triage Notes (Signed)
Patient arrived via EMS from home and patient was in his garage and his internal defibrillator fired once about 1050 and he fell down, and then got up and the defibrillator fired again about 10 minutes later, which scared him because it has never gone off while he was walking. Defibrillator was placed in 2016 due ventricular tachycardia. Patient denies hitting his head when he fell and no LOC.  Per EMS patient was A&Ox4, frequent PVCs. Dr. Marliss Czar is his cardiologist. VSS NAD noted. Patient says he is having a weird feeling in his head.

## 2020-08-07 NOTE — ED Notes (Signed)
Pt states coming in because earlier today his pacemaker/defibrillator went off 2 times in 15 minutes. Pt states he was walking and he felt it go off and made him go to the ground. Pt states it has not gone off since coming in to the ER. Pt denies chest pain at this time.

## 2020-08-07 NOTE — ED Notes (Addendum)
Informed Dr. Fran Lowes that patient had 6 beat run of Vtach and she informed me that patient is being transferred to [facility] which was arranged by cardiology, per Dr. Gwen Pounds, "EMTALA and transfer information has been already performed so we will just wait for the bed and otherwise take care of the patient until that occurs." If pt still in the ED when you get a call about transport coming to get the pt, pls just message the oncall hospitalist provider to update the EMTALA.

## 2020-08-07 NOTE — H&P (Signed)
History and Physical    [name]:[id number] DOB: October 12, 1946 DOA: 08/07/2020  PCP: Lynnea Ferrier, MD  Patient coming from: home  I have personally briefly reviewed patient's old medical records in [facility]  Chief Complaint: ICD shocked twice  HPI: Corey Zhang is a 74 y.o. Caucasian male with medical history significant of nonischemic dilated cardiomyopathy with ICD, systolic CHF, VT, CVA, who presented after his ICD fired at home twice today.  Pt was in his usual state of health and walking around in his garage when his ICD fired and caused him to drop to the ground.  Did not hit his head.  Prior to the firing, pt did feel "strange" in his head.  After he rested on the ground for about 10 minutes, he got up and got shocked again, so he decided he better call EMS since he lives alone.  No recent illness.   No chest pain or dyspnea.    Pt follows with Dr. Darrold Junker at [facility].  Pt had had recent shocks on 01/12/2020 and 02/01/2020. Interrogation revealed appropriate shock therapy for ventricular tachycardia.  Pt's metop was increased to 100 mg BID from 75 mg BID.  Pt used to take amiodarone which caused thyroid and liver toxicities, so it was discontinued.     ED Course: initial vitals: afebrile, pulse 90, BP 123/93, sating 99% on room air.  Labs notable for normal electrolytes, trop 154.  EKG showed sinus rhythm.  ED provider discussed case with cardiology Dr. Gwen Pounds.  ICD interrogation showed VFib.  Dr. Gwen Pounds discussed with cardiology at [facility] who has accepted pt for transfer.  Pt is admitted to cardiac progressive care unit until the bed becomes available in [facility] for transfer.   Assessment/Plan Active Problems:   Ventricular fibrillation (HCC)   # VFib with ICD shocks --happened twice at home today.  Pt has had prior ICD firings for VTach.  Follows with Dr. Darrold Junker at [facility].  Takes metop 100 BID.  Can not tolerate amiodarone due to organ toxicities.   --Dr. Gwen Pounds  consulted in the ED and had arranged for pt to be transferred to [facility]. PLAN: --Admit to cardiac progressive care unit --continue home ASA, metop 100mg  BID and Lisinopril. --Transfer to [facility] when bed becomes available.  Call oncall hospitalist to update EMTALA when pt is called by transport.   DVT prophylaxis: Lovenox SQ Code Status: Full code  Family Communication:   Disposition Plan: transfer to [facility] when bed available  Consults called: cardiology Admission status: Inpatient   Review of Systems: As per HPI otherwise 10 point review of systems negative.   Past Medical History:  Diagnosis Date  . Cardiomyopathy (HCC)   . CHF (congestive heart failure) (HCC)   . Hyperlipidemia   . Hypertension     Past Surgical History:  Procedure Laterality Date  . defribrillator implant       has no history on file for tobacco use, alcohol use, and drug use.  Allergies  Allergen Reactions  . Amiodarone     Hurts liver    Family History  Problem Relation Age of Onset  . Skin cancer Mother   . Thyroid disease Mother   . CAD Father   . Stroke Father   . Alcohol abuse Father   . CAD Brother   . Heart attack Brother   . Hyperlipidemia Brother      Prior to Admission medications   Medication Sig Start Date End Date Taking? Authorizing  Provider  acetaminophen (TYLENOL) 500 MG tablet Take 1,000 mg by mouth as needed.    [provider]  aspirin 81 MG EC tablet Take 81 mg by mouth daily.    [provider]  lisinopril (ZESTRIL) 10 MG tablet Take 5 mg by mouth daily. 06/05/20   [provider]  melatonin 5 MG TABS Take 5 mg by mouth at bedtime as needed for sleep.    [provider]  metoprolol succinate (TOPROL-XL) 100 MG 24 hr tablet Take 100 mg by mouth 2 (two) times daily. 07/20/20   [provider]  Multiple Vitamin (MULTI-VITAMIN) tablet Take 1 tablet by mouth daily.    [provider]    Physical Exam: Vitals:   08/07/20  1155 08/07/20 1204  BP:  (!) 123/93  Pulse:  90  Resp:  12  Temp:  98.3 F (36.8 C)  TempSrc:  Oral  SpO2: 98% 99%  Height:  5\' 5"  (1.651 m)    Constitutional: NAD, AAOx3 HEENT: conjunctivae and lids normal, EOMI CV: RRR no M,R,G. Distal pulses +2.  No cyanosis.   RESP: CTA B/L, normal respiratory effort  GI: +BS, NTND Extremities: No effusions, edema, or tenderness in BLE SKIN: warm, dry and intact Neuro: II - XII grossly intact.  Sensation intact Psych: Normal mood and affect.  Appropriate judgement and reason   Labs on Admission: I have personally reviewed following labs and imaging studies  CBC: Recent Labs  Lab 08/07/20 1355  WBC 12.6*  HGB 16.0  HCT 45.6  MCV 94.0  PLT 191   Basic Metabolic Panel: Recent Labs  Lab 08/07/20 1355  NA 138  K 4.2  CL 103  CO2 23  GLUCOSE 95  BUN 12  CREATININE 0.96  CALCIUM 9.2  MG 2.2   GFR: CrCl cannot be calculated (Unknown ideal weight.). Liver Function Tests: No results for input(s): AST, ALT, ALKPHOS, BILITOT, PROT, ALBUMIN in the last 168 hours. No results for input(s): LIPASE, AMYLASE in the last 168 hours. No results for input(s): AMMONIA in the last 168 hours. Coagulation Profile: Recent Labs  Lab 08/07/20 1355  INR 1.0   Cardiac Enzymes: No results for input(s): CKTOTAL, CKMB, CKMBINDEX, TROPONINI in the last 168 hours. BNP (last 3 results) No results for input(s): PROBNP in the last 8760 hours. HbA1C: No results for input(s): HGBA1C in the last 72 hours. CBG: No results for input(s): GLUCAP in the last 168 hours. Lipid Profile: No results for input(s): CHOL, HDL, LDLCALC, TRIG, CHOLHDL, LDLDIRECT in the last 72 hours. Thyroid Function Tests: No results for input(s): TSH, T4TOTAL, FREET4, T3FREE, THYROIDAB in the last 72 hours. Anemia Panel: No results for input(s): VITAMINB12, FOLATE, FERRITIN, TIBC, IRON, RETICCTPCT in the last 72 hours. Urine analysis:    Component Value Date/Time    COLORURINE Yellow 12/31/2014 1805   APPEARANCEUR Clear 12/31/2014 1805   LABSPEC 1.017 12/31/2014 1805   PHURINE 5.0 12/31/2014 1805   GLUCOSEU Negative 12/31/2014 1805   HGBUR Negative 12/31/2014 1805   BILIRUBINUR Negative 12/31/2014 1805   KETONESUR Negative 12/31/2014 1805   PROTEINUR 30 mg/dL 01/02/2015 44/12/270   NITRITE Negative 12/31/2014 1805   LEUKOCYTESUR Negative 12/31/2014 1805    Radiological Exams on Admission: No results found.    01/02/2015 MD [facility]  If 7PM-7AM, please contact night-coverage 08/07/2020, 6:11 PM

## 2020-08-07 NOTE — ED Notes (Signed)
Patient primary care doctor is Worthy Keeler

## 2020-08-07 NOTE — ED Notes (Signed)
Pt had 6 beat run of Vtach, informed primary RN.

## 2020-08-07 NOTE — ED Provider Notes (Signed)
[facility] Emergency Department Provider Note  ____________________________________________   First MD Initiated Contact with Patient 08/07/20 1233     (approximate)  I have reviewed the triage vital signs and the nursing notes.   HISTORY  Chief Complaint Weakness    HPI Corey Zhang is a 74 y.o. male history of nonischemic cardiomyopathy, CHF, ICD pacemaker  Patient has been in his normal health, he is at home in his garage when he started feeling lightheaded then shortly thereafter received a shock from his defibrillator.  He did not pass out, he lowered himself to the ground.  He received 1 additional shocks thereafter.  He feels much better now but reports it feels like someone hitting or kicking on the chest when he goes off.  He has had this happen several times in the past usually only once at a time for ventricular tachycardia.  Feels well now.  No recent illness.  Fully vaccinated against Covid  Follows with cardiology, Dr. Cassie Freer and Dr. Lady Gary on a regular basis   Past Medical History:  Diagnosis Date  . Cardiomyopathy (HCC)   . CHF (congestive heart failure) (HCC)   . Hyperlipidemia   . Hypertension     Patient Active Problem List   Diagnosis Date Noted  . Ventricular fibrillation (HCC) 08/07/2020    Past Surgical History:  Procedure Laterality Date  . defribrillator implant      Prior to Admission medications   Not on File    Allergies Amiodarone  No family history on file.  Social History Social History   Tobacco Use  . Smoking status: Not on file  Substance Use Topics  . Alcohol use: Not on file  . Drug use: Not on file  No alcohol or smoking  Review of Systems Constitutional: No fever/chills but did feel lightheaded during episode prior to getting shocked Eyes: No visual changes. ENT: No sore throat. Cardiovascular: Denies chest pain except when he was shocked briefly. Respiratory: Denies shortness of  breath. Gastrointestinal: No abdominal pain.   Genitourinary: Negative for dysuria. Musculoskeletal: Negative for back pain. Skin: Negative for rash. Neurological: Negative for headaches, areas of focal weakness or numbness.    ____________________________________________   PHYSICAL EXAM:  VITAL SIGNS: ED Triage Vitals  Enc Vitals Group     BP 08/07/20 1204 (!) 123/93     Pulse Rate 08/07/20 1204 90     Resp 08/07/20 1204 12     Temp 08/07/20 1204 98.3 F (36.8 C)     Temp Source 08/07/20 1204 Oral     SpO2 08/07/20 1155 98 %     Weight --      Height 08/07/20 1204 5\' 5"  (1.651 m)     Head Circumference --      Peak Flow --      Pain Score 08/07/20 1204 0     Pain Loc --      Pain Edu? --      Excl. in GC? --     Constitutional: Alert and oriented. Well appearing and in no acute distress. Eyes: Conjunctivae are normal. Head: Atraumatic. Nose: No congestion/rhinnorhea. Mouth/Throat: Mucous membranes are moist. Neck: No stridor.  Cardiovascular: Normal rate, regular rhythm. Grossly normal heart sounds.  Good peripheral circulation.  Pacemaker site clean dry intact.  Currently in normal sinus rhythm. Respiratory: Normal respiratory effort.  No retractions. Lungs CTAB. Gastrointestinal: Soft and nontender. No distention. Musculoskeletal: No lower extremity tenderness nor edema. Neurologic:  Normal speech and  language. No gross focal neurologic deficits are appreciated.  Skin:  Skin is warm, dry and intact. No rash noted. Psychiatric: Mood and affect are normal. Speech and behavior are normal.  ____________________________________________   LABS (all labs ordered are listed, but only abnormal results are displayed)  Labs Reviewed  CBC - Abnormal; Notable for the following components:      Result Value   WBC 12.6 (*)    All other components within normal limits  TROPONIN I (HIGH SENSITIVITY) - Abnormal; Notable for the following components:   Troponin I (High  Sensitivity) 154 (*)    All other components within normal limits  SARS CORONAVIRUS 2 BY RT PCR (HOSPITAL ORDER, PERFORMED IN [facility])  BASIC METABOLIC PANEL  PROTIME-INR  MAGNESIUM   ____________________________________________  EKG  Reviewed interpreted at 1155 Heart rate 90 QRS 100 QTc 450 Normal sinus rhythm, probable old anteroseptal infarct.  Occasional PVC.  No evidence of acute ischemia. ____________________________________________  RADIOLOGY   ____________________________________________   PROCEDURES  Procedure(s) performed: None  Procedures  Critical Care performed: No  ____________________________________________   INITIAL IMPRESSION / ASSESSMENT AND PLAN / ED COURSE  Pertinent labs & imaging results that were available during my care of the patient were reviewed by me and considered in my medical decision making (see chart for details).   Patient presents for concerns of being shocked twice by his ICD associated with an episode of feeling weak and lightheaded just proceeding.  Suspect likely had a cardiac arrhythmia, possibly V. tach.  Will continue to monitor closely, currently asymptomatic.  Check labs, obtain cardiology consultation in the ER.  Denies any ongoing chest pain.  Does not seem consistent with ACS, rather sounds most consistent with some type of arrhythmia, likely VT.  He did not pass out.   Clinical Course as of Aug 07 1522  Tue Aug 07, 2020  1258 Clinical case, consult request placed with Dr. Gwen Pounds.  He advises they will see in consult on the patient today   [MQ]    Clinical Course User Index [MQ] Sharyn Creamer, MD   Case discussed with Dr. Gwen Pounds.  Patient to be admitted to the hospital for observation.  Thus far lab results do not show a clearly reversible electrolyte abnormality.  Admission discussed with Dr. Fran Lowes  Patient understanding agreeable with plan for  admission.  ____________________________________________   FINAL CLINICAL IMPRESSION(S) / ED DIAGNOSES  Final diagnoses:  Ventricular arrhythmia        Note:  This document was prepared using Dragon voice recognition software and may include unintentional dictation errors       Sharyn Creamer, MD 08/07/20 1523

## 2020-08-07 NOTE — ED Notes (Signed)
Defib pads placed on pts chest and back

## 2020-08-07 NOTE — Consult Note (Signed)
[facility] Cardiology Consultation Note  Patient ID: Corey Zhang, MRN: [id number], DOB/AGE: 1946-10-10 74 y.o. Admit date: 08/07/2020   Date of Consult: 08/07/2020 Primary Physician: Lynnea Ferrier, MD Primary Cardiologist: Paraschos  Chief Complaint:  Chief Complaint  Patient presents with  . Weakness   Reason for Consult: Shocks  HPI: 74 y.o. male with known dilated cardiomyopathy due to myocarditis which is nonischemic with cardiac catheterization in the past showing normal coronary arteries.  Patient has been treated well with appropriate medication management for congestive heart failure and has had no evidence of anginal symptoms or congestive heart failure recently.  In recent months the patient has had increase in episodes of ventricular tachycardia some nonsustained but others sustained requiring ventricular defibrillation by ICD.  With this the patient previously has been on amiodarone for which she is not able to tolerate due to significant tremors and other side effects.  In addition to that the patient has had consultation with electrophysiology for which VT ablation as well as flecainide has been considered.  The patient therefore was awaiting further decisions when he had an episode of what appears to be ventricular fibrillation and shock twice today for which he recovered fairly well and had no evidence of significant syncope dizziness nausea diaphoresis chest pain or heart failure symptoms.  Currently he feels well at this time with no evidence of other symptoms and an EKG showing normal sinus rhythm.  We have discussed at length that his MRI does show diffuse scarring consistent with myocarditis and ejection fraction of 17%.  We also have discussed the possibility of further treatment including flecainide and or ablation and/or further consultation with electrophysiology.  He is under the understanding of treating this way  Past Medical History:  Diagnosis Date  .  Cardiomyopathy (HCC)   . CHF (congestive heart failure) (HCC)   . Hyperlipidemia   . Hypertension       Surgical History:  Past Surgical History:  Procedure Laterality Date  . defribrillator implant       Home Meds: Prior to Admission medications   Not on File    Inpatient Medications:     Allergies:  Allergies  Allergen Reactions  . Amiodarone     Hurts liver    Social History   Socioeconomic History  . Marital status: Widowed    Spouse name: Not on file  . Number of children: Not on file  . Years of education: Not on file  . Highest education level: Not on file  Occupational History  . Not on file  Tobacco Use  . Smoking status: Not on file  Substance and Sexual Activity  . Alcohol use: Not on file  . Drug use: Not on file  . Sexual activity: Not on file  Other Topics Concern  . Not on file  Social History Narrative  . Not on file   Social Determinants of Health   Financial Resource Strain:   . Difficulty of Paying Living Expenses: Not on file  Food Insecurity:   . Worried About Programme researcher, broadcasting/film/video in the Last Year: Not on file  . Ran Out of Food in the Last Year: Not on file  Transportation Needs:   . Lack of Transportation (Medical): Not on file  . Lack of Transportation (Non-Medical): Not on file  Physical Activity:   . Days of Exercise per Week: Not on file  . Minutes of Exercise per Session: Not on file  Stress:   .  Feeling of Stress : Not on file  Social Connections:   . Frequency of Communication with Friends and Family: Not on file  . Frequency of Social Gatherings with Friends and Family: Not on file  . Attends Religious Services: Not on file  . Active Member of Clubs or Organizations: Not on file  . Attends Banker Meetings: Not on file  . Marital Status: Not on file  Intimate Partner Violence:   . Fear of Current or Ex-Partner: Not on file  . Emotionally Abused: Not on file  . Physically Abused: Not on file  .  Sexually Abused: Not on file     No family history on file.   Review of Systems Positive for shocks Negative for: General:  chills, fever, night sweats or weight changes.  Cardiovascular: PND orthopnea syncope dizziness  Dermatological skin lesions rashes Respiratory: Cough congestion Urologic: Frequent urination urination at night and hematuria Abdominal: negative for nausea, vomiting, diarrhea, bright red blood per rectum, melena, or hematemesis Neurologic: negative for visual changes, and/or hearing changes  All other systems reviewed and are otherwise negative except as noted above.  Labs: No results for input(s): CKTOTAL, CKMB, TROPONINI in the last 72 hours. Lab Results  Component Value Date   WBC 9.0 05/11/2020   HGB 15.5 05/11/2020   HCT 45.5 05/11/2020   MCV 93.6 05/11/2020   PLT 144 (L) 05/11/2020   No results for input(s): NA, K, CL, CO2, BUN, CREATININE, CALCIUM, PROT, BILITOT, ALKPHOS, ALT, AST, GLUCOSE in the last 168 hours.  Invalid input(s): LABALBU Lab Results  Component Value Date   CHOL 165 01/01/2015   HDL 25 (L) 01/01/2015   LDLCALC 114 (H) 01/01/2015   TRIG 132 01/01/2015   No results found for: DDIMER  Radiology/Studies:  No results found.  EKG: Normal sinus rhythm left atrial enlargement left anterior fascicular block with preventricular contractions  Weights: There were no vitals filed for this visit.   Physical Exam: Blood pressure (!) 123/93, pulse 90, temperature 98.3 F (36.8 C), temperature source Oral, resp. rate 12, height 5\' 5"  (1.651 m), SpO2 99 %. There is no height or weight on file to calculate BMI. General: Well developed, well nourished, in no acute distress. Head eyes ears nose throat: Normocephalic, atraumatic, sclera non-icteric, no xanthomas, nares are without discharge. No apparent thyromegaly and/or mass  Lungs: Normal respiratory effort.  no wheezes, no rales, no rhonchi.  Heart: RRR with normal S1 S2. no murmur  gallop, no rub, PMI is normal size and placement, carotid upstroke normal without bruit, jugular venous pressure is normal Abdomen: Soft, non-tender, non-distended with normoactive bowel sounds. No hepatomegaly. No rebound/guarding. No obvious abdominal masses. Abdominal aorta is normal size without bruit Extremities: No edema. no cyanosis, no clubbing, no ulcers  Peripheral : 2+ bilateral upper extremity pulses, 2+ bilateral femoral pulses, 2+ bilateral dorsal pedal pulse Neuro: Alert and oriented. No facial asymmetry. No focal deficit. Moves all extremities spontaneously. Musculoskeletal: Normal muscle tone without kyphosis Psych:  Responds to questions appropriately with a normal affect.    Assessment: 74 year old male with known dilated cardiomyopathy due to nonischemic changes from previous myocarditis with ejection fraction of 17% without evidence of angina acute coronary syndrome and/or congestive heart failure having recurrent episodes of shocks with interrogation of defibrillator consistent with ventricular tachycardia as well as episodes of ventricular fibrillation requiring shocks.  Plan: 1.  Admit to hospital for observation of further episodes of ventricular tachycardia and ventricular fibrillation requiring shocks and further  therapy 2.  Abstain from amiodarone due to previous significant side effects of the amiodarone 3.  Continue metoprolol for further risk reduction of cardiomyopathy LV systolic dysfunction and ventricular tachycardia 4.  After long discussion with patient Dr. Darrold Junker and Dr. Maisie Fus at electrophysiology the patient would likely benefit from possible further tertiary care therapy.  Therefore the patient will be transferred for further electrophysiologic study and intervention.  This has all been completed and is currently awaiting for that transfer 5.  No further cardiac diagnostics necessary at this time due to recent MRI showing LV systolic dysfunction unchanged  from before   Signed, Lamar Blinks M.D. [facility] Cardiology 08/07/2020, 1:56 PM

## 2020-08-07 NOTE — ED Notes (Signed)
Hospitalist notified of increased troponin.

## 2020-08-08 ENCOUNTER — Encounter: Payer: Self-pay | Admitting: Hospitalist

## 2020-08-08 LAB — BASIC METABOLIC PANEL
Anion gap: 10 (ref 5–15)
BUN: 10 mg/dL (ref 8–23)
CO2: 20 mmol/L — ABNORMAL LOW (ref 22–32)
Calcium: 8.8 mg/dL — ABNORMAL LOW (ref 8.9–10.3)
Chloride: 106 mmol/L (ref 98–111)
Creatinine, Ser: 0.89 mg/dL (ref 0.61–1.24)
GFR calc Af Amer: >60 mL/min (ref >60)
GFR calc non Af Amer: >60 mL/min (ref >60)
Glucose, Bld: 94 mg/dL (ref 70–99)
Potassium: 3.8 mmol/L (ref 3.5–5.1)
Sodium: 136 mmol/L (ref 135–145)

## 2020-08-08 LAB — CBC
HCT: 40.4 % (ref 39.0–52.0)
Hemoglobin: 15 g/dL (ref 13.0–17.0)
MCH: 33.7 pg (ref 26.0–34.0)
MCHC: 37.1 g/dL — ABNORMAL HIGH (ref 30.0–36.0)
MCV: 90.8 fL (ref 80.0–100.0)
Platelets: 171 10*3/uL (ref 150–400)
RBC: 4.45 MIL/uL (ref 4.22–5.81)
RDW: 12.6 % (ref 11.5–15.5)
WBC: 10.5 10*3/uL (ref 4.0–10.5)
nRBC: 0 % (ref 0.0–0.2)

## 2020-08-08 LAB — MAGNESIUM: Magnesium: 2.1 mg/dL (ref 1.7–2.4)

## 2020-08-08 NOTE — ED Notes (Signed)
RN asked provider if they would like a repeat troponin at this time with morning lab work. Provider stated it was needed at this time.

## 2020-08-08 NOTE — ED Notes (Signed)
Attempted to call for pt's room. Per Charge Tammy RN is unable to take call at this time. Ascom number provided and RN from 2A to call back shortly  Will inform RN [employer]

## 2020-08-08 NOTE — ED Notes (Signed)
Pt transported to 2A-231

## 2020-08-08 NOTE — Progress Notes (Signed)
Patient admitted to 2A-231. Patient ambulatory at time of admission. VSS. Patient oriented to room, call bell, and nursing staff.

## 2020-08-08 NOTE — Progress Notes (Signed)
[facility]  - [place] [facility]   PATIENT NAME: Corey Zhang    MR#:  [id number]  DATE OF BIRTH:  [birth year]  SUBJECTIVE:   Patient came in after his AICD/pacemaker fired twice while at home yesterday. Feels back to baseline. He is on a transfer list to [facility]-- no beds available so far  REVIEW OF SYSTEMS:   Review of Systems  Constitutional: Negative for chills, fever and weight loss.  HENT: Negative for ear discharge, ear pain and nosebleeds.   Eyes: Negative for blurred vision, pain and discharge.  Respiratory: Negative for sputum production, shortness of breath, wheezing and stridor.   Cardiovascular: Negative for chest pain, palpitations, orthopnea and PND.  Gastrointestinal: Negative for abdominal pain, diarrhea, nausea and vomiting.  Genitourinary: Negative for frequency and urgency.  Musculoskeletal: Negative for back pain and joint pain.  Neurological: Negative for sensory change, speech change, focal weakness and weakness.  Psychiatric/Behavioral: Negative for depression and hallucinations. The patient is not nervous/anxious.    Tolerating Diet:yes Tolerating PT: not needed  DRUG ALLERGIES:   Allergies  Allergen Reactions  . Amiodarone Other (See Comments)    Hurts liver Liver and thyroid panels went up, also shaky  . Other Swelling    Ant venom    VITALS:  Blood pressure 120/80, pulse 70, temperature 97.8 F (36.6 C), temperature source Oral, resp. rate 19, height 5\' 9"  (1.753 m), weight 69.2 kg, SpO2 98 %.  PHYSICAL EXAMINATION:   Physical Exam  GENERAL:  74 y.o.-year-old patient lying in the bed with no acute distress.  EYES: Pupils equal, round, reactive to light and accommodation. No scleral icterus.   HEENT: Head atraumatic, normocephalic. Oropharynx and nasopharynx clear.  NECK:  Supple, no jugular venous distention. No thyroid enlargement, no tenderness.  LUNGS: Normal breath sounds bilaterally, no wheezing, rales, rhonchi. No use  of accessory muscles of respiration.  CARDIOVASCULAR: S1, S2 normal. No murmurs, rubs, or gallops.  ABDOMEN: Soft, nontender, nondistended. Bowel sounds present. No organomegaly or mass.  EXTREMITIES: No cyanosis, clubbing or edema b/l.    NEUROLOGIC: Cranial nerves II through XII are intact. No focal Motor or sensory deficits b/l.   PSYCHIATRIC:  patient is alert and oriented x 3.  SKIN: No obvious rash, lesion, or ulcer.   LABORATORY PANEL:  CBC Recent Labs  Lab 08/08/20 0416  WBC 10.5  HGB 15.0  HCT 40.4  PLT 171    Chemistries  Recent Labs  Lab 08/08/20 0416  NA 136  K 3.8  CL 106  CO2 20*  GLUCOSE 94  BUN 10  CREATININE 0.89  CALCIUM 8.8*  MG 2.1   Cardiac Enzymes No results for input(s): TROPONINI in the last 168 hours. RADIOLOGY:  No results found. ASSESSMENT AND PLAN:   KAMIL MCHAFFIE a 74 y.o.Caucasianmalewith medical history significant ofnonischemic dilated cardiomyopathy with ICD, systolic CHF, VT, CVA,who presented after his ICD fired at home twice today.  # VFib with ICD shocks- ICD fired twice at home today. Pt has had prior ICD firings for VTach.  -Follows with Dr. 66 at [facility].  -Takes metop 100 BID. - Could not tolerate amiodarone due to organ toxicities.  -Dr. [facility] consulted in the ED and had arranged for pt to be transferred to [facility] for further management.  -cont on home ASA, metop 100mg  BID and Lisinopril. -No beds available yet at [facility].  Hypertension continue lisinopril and metoprolol  Non-ischemic cardiomyopathy -sats stable -not in heart failure  Procedures:none Family communication :none Consults :Cardiology-[facility] CODE STATUS: FULL DVT Prophylaxis :lovenox  Status is: Inpatient  Remains inpatient appropriate because:Inpatient level of care appropriate due to severity of illness   Dispo: The patient is from: Home              Anticipated d/c is to: To be in determine              Anticipated  d/c date is: 1 day              Patient currently is not medically stable to d/c.  Patient is admitted for recurrent V tach with ICD firing. He is on a transfer list to [facility] since his electrophysiologist is at [facility].      TOTAL TIME TAKING CARE OF THIS PATIENT: *25* minutes.  >50% time spent on counselling and coordination of care  Note: This dictation was prepared with Dragon dictation along with smaller phrase technology. Any transcriptional errors that result from this process are unintentional.  Enedina Finner M.D    [facility]   CC: Primary care physician; Lynnea Ferrier, MDPatient ID: Champ Mungo, male   DOB: 02-07-1946, 74 y.o.   MRN: [id number]

## 2020-08-08 NOTE — ED Notes (Signed)
Pt up walking to the bathroom with no assistance.

## 2020-08-08 NOTE — Plan of Care (Signed)

## 2020-08-08 NOTE — Progress Notes (Signed)
   SUBJECTIVE: Patient has felt well overnight.  No evidence of congestive heart failure or anginal symptoms.  No evidence of rhythm disturbances ventricular tachycardia or ventricular fibrillation.  Patient does have a minimal elevation of troponin at this time most consistent with his multiple defibrillations yesterday.  Patient does have a interrogation of defibrillator suggesting V. tach and V. fib as primary concern.  We have had long discussions with primary cardiologist as well as electrophysiology for which he may need some intervention not available here at [facility].  Therefore will continue to treat appropriately with current medical regimen for which she is stable.  Transfer when able  Vitals:   08/08/20 0500 08/08/20 0600 08/08/20 0715 08/08/20 0830  BP: 111/75 108/78  115/74  Pulse: 60 62  91  Resp: 13 17  11   Temp:      TempSrc:      SpO2: 98% 98%  99%  Weight:   70.8 kg   Height:   5\' 5"  (1.651 m)    No intake or output data in the 24 hours ending 08/08/20 0859  LABS: Basic Metabolic Panel: Recent Labs    08/07/20 1355 08/08/20 0416  NA 138 136  K 4.2 3.8  CL 103 106  CO2 23 20*  GLUCOSE 95 94  BUN 12 10  CREATININE 0.96 0.89  CALCIUM 9.2 8.8*  MG 2.2 2.1   Liver Function Tests: No results for input(s): AST, ALT, ALKPHOS, BILITOT, PROT, ALBUMIN in the last 72 hours. No results for input(s): LIPASE, AMYLASE in the last 72 hours. CBC: Recent Labs    08/07/20 1355 08/08/20 0416  WBC 12.6* 10.5  HGB 16.0 15.0  HCT 45.6 40.4  MCV 94.0 90.8  PLT 191 171   Cardiac Enzymes: No results for input(s): CKTOTAL, CKMB, CKMBINDEX, TROPONINI in the last 72 hours. BNP: Invalid input(s): POCBNP D-Dimer: No results for input(s): DDIMER in the last 72 hours. Hemoglobin A1C: No results for input(s): HGBA1C in the last 72 hours. Fasting Lipid Panel: No results for input(s): CHOL, HDL, LDLCALC, TRIG, CHOLHDL, LDLDIRECT in the last 72 hours. Thyroid Function  Tests: No results for input(s): TSH, T4TOTAL, T3FREE, THYROIDAB in the last 72 hours.  Invalid input(s): FREET3 Anemia Panel: No results for input(s): VITAMINB12, FOLATE, FERRITIN, TIBC, IRON, RETICCTPCT in the last 72 hours.   PHYSICAL EXAM General: Well developed, well nourished, in no acute distress HEENT:  Normocephalic and atramatic Neck:  No JVD.  Lungs: Clear bilaterally to auscultation and percussion. Heart: HRRR . Normal S1 and S2 without gallops or murmurs.  Abdomen: Bowel sounds are positive, abdomen soft and non-tender  Msk:  Back normal, normal gait. Normal strength and tone for age. Extremities: No clubbing, cyanosis or edema.   Neuro: Alert and oriented X 3. 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  We have discussed at length further intervention for which she will need transfer to [facility] with his primary electrophysiologist for electrophysiologic study and/or possible ventricular tachycardia ablation versus other medication management Active Problems:   Ventricular fibrillation (HCC)    66, MD, [facility] 08/08/2020 8:59 AM

## 2020-08-08 NOTE — Progress Notes (Signed)
13 beat run of vtach, resolved by PM/ICD. Patient now paced rhythm. Dr. Allena Katz notified. Patient asymptomatic during episode.    120/80(92), 70, 99% RA.

## 2020-08-08 NOTE — ED Notes (Signed)
Pt showed several episodes of PVCs. RN showed ER provider to verify and then messaged hospitalist:  Jason Coop, this pts monitor went off and he had an episode of 4PVCs, a regular beat, and then 3 beats of PVC. Pt denies pain and is verbal. Pt states he did not notice any thing either

## 2020-08-08 NOTE — Progress Notes (Signed)
12 beat run of vtach, VSS, pt asymptomatic. Dr. Beryl Meager and Gwen Pounds notified.

## 2020-08-09 DIAGNOSIS — I499 Cardiac arrhythmia, unspecified: Secondary | ICD-10-CM

## 2020-08-09 LAB — CBC
HCT: 42.6 % (ref 39.0–52.0)
Hemoglobin: 15.6 g/dL (ref 13.0–17.0)
MCH: 33.3 pg (ref 26.0–34.0)
MCHC: 36.6 g/dL — ABNORMAL HIGH (ref 30.0–36.0)
MCV: 90.8 fL (ref 80.0–100.0)
Platelets: 180 10*3/uL (ref 150–400)
RBC: 4.69 MIL/uL (ref 4.22–5.81)
RDW: 12.9 % (ref 11.5–15.5)
WBC: 10.9 10*3/uL — ABNORMAL HIGH (ref 4.0–10.5)
nRBC: 0 % (ref 0.0–0.2)

## 2020-08-09 LAB — BASIC METABOLIC PANEL
Anion gap: 9 (ref 5–15)
BUN: 13 mg/dL (ref 8–23)
CO2: 25 mmol/L (ref 22–32)
Calcium: 8.8 mg/dL — ABNORMAL LOW (ref 8.9–10.3)
Chloride: 104 mmol/L (ref 98–111)
Creatinine, Ser: 1.05 mg/dL (ref 0.61–1.24)
GFR calc Af Amer: >60 mL/min (ref >60)
GFR calc non Af Amer: >60 mL/min (ref >60)
Glucose, Bld: 91 mg/dL (ref 70–99)
Potassium: 4 mmol/L (ref 3.5–5.1)
Sodium: 138 mmol/L (ref 135–145)

## 2020-08-09 LAB — MAGNESIUM: Magnesium: 2.2 mg/dL (ref 1.7–2.4)

## 2020-08-09 NOTE — Discharge Summary (Signed)
[facility] - [place] [facility]   PATIENT NAME: Corey Zhang    MR#:  [id number]  DATE OF BIRTH:  May 25, 1946  DATE OF ADMISSION:  08/07/2020 ADMITTING PHYSICIAN: Darlin Priestly, MD  DATE OF DISCHARGE: 08/09/2020  PRIMARY CARE PHYSICIAN: Lynnea Ferrier, MD    ADMISSION DIAGNOSIS:  Ventricular fibrillation (HCC) [I49.01] Ventricular arrhythmia [I49.9]  DISCHARGE DIAGNOSIS:  Ventricular arrhythmia H/o ICD placement in the past  SECONDARY DIAGNOSIS:   Past Medical History:  Diagnosis Date   Cardiomyopathy (HCC)    CHF (congestive heart failure) (HCC)    Hyperlipidemia    Hypertension     HOSPITAL COURSE:   Corey Tiger Mooseis a 74 y.o.Caucasianmalewith medical history significant ofnonischemic dilated cardiomyopathy with ICD, systolic CHF, VT, CVA,who presented after his ICD fired at home twice today.  # VFib with ICD shocks ICD firedtwice at home today. Pt has had prior ICD firings for VTach.  -Follows with Dr. Darrold Junker at [facility].  -Couldnot tolerate amiodarone due to organ toxicities.  -Dr. Gwen Pounds consulted in the ED and had arranged for pt to be transferred to [facility] further management-- pt has a bed today and he is in agreement for transfer. -cont onhome ASA, metoprolol 100mg  BID and Lisinopril.  Hypertension continue lisinopril and metoprolol  Non-ischemic cardiomyopathy -sats stable -not in heart failure    Procedures:none Family communication :none Consults :Cardiology-[facility] CODE STATUS: FULL DVT Prophylaxis :lovenox  Status is: Inpatient  Remains inpatient appropriate because:Inpatient level of care appropriate due to severity of illness   Dispo: The patient is from: Home  Anticipated d/c is to: [facility]  Anticipated d/c date is: today  Patient currently is medically stable to d/c to [facility] for further evaluation management office ventricular  arrhythmia   CONSULTS OBTAINED:    DRUG ALLERGIES:   Allergies  Allergen Reactions   Amiodarone Other (See Comments)    Hurts liver Liver and thyroid panels went up, also shaky   Other Swelling    Ant venom    DISCHARGE MEDICATIONS:   Allergies as of 08/09/2020      Reactions   Amiodarone Other (See Comments)   Hurts liver Liver and thyroid panels went up, also shaky   Other Swelling   Ant venom      Medication List    TAKE these medications   acetaminophen 500 MG tablet Commonly known as: TYLENOL Take 1,000 mg by mouth as needed.   aspirin 81 MG EC tablet Take 81 mg by mouth daily.   lisinopril 10 MG tablet Commonly known as: ZESTRIL Take 5 mg by mouth daily.   melatonin 5 MG Tabs Take 5 mg by mouth at bedtime as needed for sleep.   metoprolol succinate 100 MG 24 hr tablet Commonly known as: TOPROL-XL Take 100 mg by mouth 2 (two) times daily.   Multi-Vitamin tablet Take 1 tablet by mouth daily.       If you experience worsening of your admission symptoms, develop shortness of breath, life threatening emergency, suicidal or homicidal thoughts you must seek medical attention immediately by calling 911 or calling your MD immediately  if symptoms less severe.  You Must read complete instructions/literature along with all the possible adverse reactions/side effects for all the Medicines you take and that have been prescribed to you. Take any new Medicines after you have completely understood and accept all the possible adverse reactions/side effects.   Please note  You were cared for by a hospitalist during your  hospital stay. If you have any questions about your discharge medications or the care you received while you were in the hospital after you are discharged, you can call the unit and asked to speak with the hospitalist on call if the hospitalist that took care of you is not available. Once you are discharged, your primary care physician will handle  any further medical issues. Please note that NO REFILLS for any discharge medications will be authorized once you are discharged, as it is imperative that you return to your primary care physician (or establish a relationship with a primary care physician if you do not have one) for your aftercare needs so that they can reassess your need for medications and monitor your lab values. Today   SUBJECTIVE   No new complaints per RN intermittent runs of VT. Patient asymptomatic half  VITAL SIGNS:  Blood pressure 125/72, pulse 75, temperature 97.7 F (36.5 C), temperature source Oral, resp. rate 17, height 5\' 9"  (1.753 m), weight 69.1 kg, SpO2 100 %.  I/O:    Intake/Output Summary (Last 24 hours) at 08/09/2020 1019 Last data filed at 08/09/2020 0719 Gross per 24 hour  Intake 480 ml  Output 1425 ml  Net -945 ml    PHYSICAL EXAMINATION:  GENERAL:  74 y.o.-year-old patient lying in the bed with no acute distress.  EYES: Pupils equal, round, reactive to light and accommodation. No scleral icterus.  HEENT: Head atraumatic, normocephalic. Oropharynx and nasopharynx clear.  NECK:  Supple, no jugular venous distention. No thyroid enlargement, no tenderness.  LUNGS: Normal breath sounds bilaterally, no wheezing, rales,rhonchi or crepitation. No use of accessory muscles of respiration.  CARDIOVASCULAR: S1, S2 normal. No murmurs, rubs, or gallops.  ABDOMEN: Soft, non-tender, non-distended. Bowel sounds present. No organomegaly or mass.  EXTREMITIES: No pedal edema, cyanosis, or clubbing.  NEUROLOGIC: Cranial nerves II through XII are intact. Muscle strength 5/5 in all extremities. Sensation intact. Gait not checked.  PSYCHIATRIC: The patient is alert and oriented x 3.  SKIN: No obvious rash, lesion, or ulcer.   DATA REVIEW:   CBC  Recent Labs  Lab 08/09/20 0641  WBC 10.9*  HGB 15.6  HCT 42.6  PLT 180    Chemistries  Recent Labs  Lab 08/09/20 0641  NA 138  K 4.0  CL 104  CO2 25   GLUCOSE 91  BUN 13  CREATININE 1.05  CALCIUM 8.8*  MG 2.2    Microbiology Results   Recent Results (from the past 240 hour(s))  SARS Coronavirus 2 by RT PCR (hospital order, performed in [facility] hospital lab) Nasopharyngeal Nasopharyngeal Swab     Status: None   Collection Time: 08/07/20 12:59 PM   Specimen: Nasopharyngeal Swab  Result Value Ref Range Status   SARS Coronavirus 2 NEGATIVE NEGATIVE Final    Comment: (NOTE) SARS-CoV-2 target nucleic acids are NOT DETECTED.  The SARS-CoV-2 RNA is generally detectable in upper and lower respiratory specimens during the acute phase of infection. The lowest concentration of SARS-CoV-2 viral copies this assay can detect is 250 copies / mL. A negative result does not preclude SARS-CoV-2 infection and should not be used as the sole basis for treatment or other patient management decisions.  A negative result may occur with improper specimen collection / handling, submission of specimen other than nasopharyngeal swab, presence of viral mutation(s) within the areas targeted by this assay, and inadequate number of viral copies (<250 copies / mL). A negative result must be combined with clinical observations,  patient history, and epidemiological information.  Fact Sheet for Patients:   BoilerBrush.com.cy  Fact Sheet for Healthcare Providers: https://pope.com/  This test is not yet approved or  cleared by the [place] FDA and has been authorized for detection and/or diagnosis of SARS-CoV-2 by FDA under an Emergency Use Authorization (EUA).  This EUA will remain in effect (meaning this test can be used) for the duration of the COVID-19 declaration under Section 564(b)(1) of the Act, 21 U.S.C. section 360bbb-3(b)(1), unless the authorization is terminated or revoked sooner.  Performed at [facility], [address] [place], [place] [address]     RADIOLOGY:  No  results found.   CODE STATUS:     Code Status Orders  (From admission, onward)         Start     Ordered   08/07/20 1929  Full code  Continuous        08/07/20 1928        Code Status History    This patient has a current code status but no historical code status.   Advance Care Planning Activity    Advance Directive Documentation     Most Recent Value  Type of Advance Directive Living will, Healthcare Power of Attorney  Pre-existing out of facility DNR order (yellow form or pink MOST form) --  "MOST" Form in Place? --       TOTAL TIME TAKING CARE OF THIS PATIENT: *35* minutes.    Enedina Finner M.D  [facility]    CC: Primary care physician; Lynnea Ferrier, MD

## 2020-08-09 NOTE — Progress Notes (Signed)
SUBJECTIVE: Patient has felt well overnight.  Patient did have some nonsustained ventricular tachycardia overnight which was asymptomatic.  No evidence of congestive heart failure or anginal symptoms.   Patient does have a minimal elevation of troponin at this time most consistent with his multiple defibrillations on admission.  Patient does have a interrogation of defibrillator suggesting V. tach and V. fib as primary concern.  We have had long discussions with primary cardiologist as well as electrophysiology for which he may need some intervention not available here at [facility].  Therefore will continue to treat appropriately with current medical regimen for which he is stable..  Transfer when able and has had reassurance by [facility] [place] they will take him today which is Thursday  Vitals:   08/08/20 1811 08/08/20 1951 08/09/20 0454 08/09/20 0719  BP: 108/64 102/62 107/73 106/68  Pulse: 83 74 69 65  Resp:  20 20 17   Temp:  97.7 F (36.5 C) 98 F (36.7 C) 97.8 F (36.6 C)  TempSrc:  Oral Oral Oral  SpO2: 99% 99% 100% 98%  Weight:   69.1 kg   Height:        Intake/Output Summary (Last 24 hours) at 08/09/2020 0758 Last data filed at 08/09/2020 0418 Gross per 24 hour  Intake 480 ml  Output 1325 ml  Net -845 ml    LABS: Basic Metabolic Panel: Recent Labs    08/08/20 0416 08/09/20 0641  NA 136 138  K 3.8 4.0  CL 106 104  CO2 20* 25  GLUCOSE 94 91  BUN 10 13  CREATININE 0.89 1.05  CALCIUM 8.8* 8.8*  MG 2.1 2.2   Liver Function Tests: No results for input(s): AST, ALT, ALKPHOS, BILITOT, PROT, ALBUMIN in the last 72 hours. No results for input(s): LIPASE, AMYLASE in the last 72 hours. CBC: Recent Labs    08/08/20 0416 08/09/20 0641  WBC 10.5 10.9*  HGB 15.0 15.6  HCT 40.4 42.6  MCV 90.8 90.8  PLT 171 180   Cardiac Enzymes: No results for input(s): CKTOTAL, CKMB, CKMBINDEX, TROPONINI in the last 72 hours. BNP: Invalid input(s): POCBNP D-Dimer: No results for  input(s): DDIMER in the last 72 hours. Hemoglobin A1C: No results for input(s): HGBA1C in the last 72 hours. Fasting Lipid Panel: No results for input(s): CHOL, HDL, LDLCALC, TRIG, CHOLHDL, LDLDIRECT in the last 72 hours. Thyroid Function Tests: No results for input(s): TSH, T4TOTAL, T3FREE, THYROIDAB in the last 72 hours.  Invalid input(s): FREET3 Anemia Panel: No results for input(s): VITAMINB12, FOLATE, FERRITIN, TIBC, IRON, RETICCTPCT in the last 72 hours.   PHYSICAL EXAM General: Well developed, well nourished, in no acute distress HEENT:  Normocephalic and atramatic Neck:  No JVD.  Lungs: Clear bilaterally to auscultation and percussion. Heart: HRRR . Normal S1 and S2 without gallops or murmurs.  Abdomen: Bowel sounds are positive, abdomen soft and non-tender  Msk:  Back normal, normal gait. Normal strength and tone for age. Extremities: No clubbing, cyanosis or edema.   Neuro: Alert and oriented X 3. 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  We have discussed at length further intervention for which she will need transfer to [facility]  with his primary electrophysiologist for electrophysiologic study and/or possible ventricular tachycardia ablation versus other medication management.  Otherwise no further intervention at this time awaiting for transfer. Active Problems:   Ventricular fibrillation (HCC)    Lamar Blinks, MD, [facility] 08/09/2020 7:58 AM

## 2020-08-09 NOTE — Progress Notes (Signed)
Received a call from Sheryn Bison at [facility] that they have a bed available. Room 7125. Report given to Sarah. [phone]. Patient aware. And Dr. Allena Katz notified.

## 2020-08-09 NOTE — Progress Notes (Signed)
Corey Zhang to be transfer to [facility]  per MD for further electrophysiologic intervention. Report given to Maralyn Sago at [facility]. Tele box removed and returned. Pt transferred with IV in place. [facility] flight to transport pt. Pt escorted via stretcher.  [name]

## 2022-02-26 ENCOUNTER — Ambulatory Visit: Payer: Medicare Other

## 2022-02-27 ENCOUNTER — Ambulatory Visit (LOCAL_COMMUNITY_HEALTH_CENTER): Payer: Medicare Other

## 2022-02-27 DIAGNOSIS — Z719 Counseling, unspecified: Secondary | ICD-10-CM

## 2022-02-27 DIAGNOSIS — Z23 Encounter for immunization: Secondary | ICD-10-CM | POA: Diagnosis not present

## 2022-02-27 NOTE — Progress Notes (Signed)
?  Are you feeling sick today? No ? ? ?Have you ever received a dose of COVID-19 Vaccine? [employer], [place], [place], [place], Other) Yes ? ?If yes, which vaccine and how many doses?   Pfizer primary series 2 doses, Pfizer monovalent booster 2 doses ? ? ?Did you bring the vaccination record card or other documentation?  Yes ? ? ?Do you have a health condition or are undergoing treatment that makes you moderately or severely immunocompromised? This would include, but not be limited to: cancer, HIV, organ transplant, immunosuppressive therapy/high-dose corticosteroids, or moderate/severe primary immunodeficiency.  No ? ?Have you received COVID-19 vaccine before or during hematopoietic cell transplant (HCT) or CAR-T-cell therapies? No ? ?Have you ever had an allergic reaction to: (This would include a severe allergic reaction or a reaction that caused hives, swelling, or respiratory distress, including wheezing.) A component of a COVID-19 vaccine or a previous dose of COVID-19 vaccine? No ? ? ?Have you ever had an allergic reaction to another vaccine (other thanCOVID-19 vaccine) or an injectable medication? (This would include a severe allergic reaction or a reaction that caused hives, swelling, or respiratory distress, including wheezing.)   No ?  ?Do you have a history of any of the following: ? ?Myocarditis or Pericarditis No ?Thrombosis with thrombocytopenia syndrome (TTS) No ?Multisystem Inflammatory Syndrome (MIS-C or MIS-A)? No ?Immune-mediate syndrome defined by thrombosis and thrombocytopenia, such as heparin--induced thrombocytopenia (HIT)  No ?Guillain-Barr? Syndrome (GBS) No ?COVID-19 disease within the past 3 months? No ?Vaccinated with monkeypox vaccine in the last 4 weeks? No ? ?NCIR and COVID card updated and given to patient. Ranae Palms, RN ? ?

## 2022-04-03 ENCOUNTER — Emergency Department: Payer: Medicare Other

## 2022-04-03 ENCOUNTER — Inpatient Hospital Stay
Admission: EM | Admit: 2022-04-03 | Discharge: 2022-04-08 | DRG: 177 | Disposition: A | Discharge status: Home Health | Payer: Medicare Other | Attending: Internal Medicine | Admitting: Internal Medicine

## 2022-04-03 ENCOUNTER — Other Ambulatory Visit: Payer: Self-pay

## 2022-04-03 DIAGNOSIS — K219 Gastro-esophageal reflux disease without esophagitis: Secondary | ICD-10-CM | POA: Diagnosis present

## 2022-04-03 DIAGNOSIS — Z83438 Family history of other disorder of lipoprotein metabolism and other lipidemia: Secondary | ICD-10-CM | POA: Diagnosis not present

## 2022-04-03 DIAGNOSIS — J9601 Acute respiratory failure with hypoxia: Secondary | ICD-10-CM | POA: Diagnosis present

## 2022-04-03 DIAGNOSIS — Z808 Family history of malignant neoplasm of other organs or systems: Secondary | ICD-10-CM

## 2022-04-03 DIAGNOSIS — I340 Nonrheumatic mitral (valve) insufficiency: Secondary | ICD-10-CM | POA: Diagnosis present

## 2022-04-03 DIAGNOSIS — Z8673 Personal history of transient ischemic attack (TIA), and cerebral infarction without residual deficits: Secondary | ICD-10-CM

## 2022-04-03 DIAGNOSIS — Z888 Allergy status to other drugs, medicaments and biological substances status: Secondary | ICD-10-CM | POA: Diagnosis not present

## 2022-04-03 DIAGNOSIS — Z79899 Other long term (current) drug therapy: Secondary | ICD-10-CM | POA: Diagnosis not present

## 2022-04-03 DIAGNOSIS — I499 Cardiac arrhythmia, unspecified: Secondary | ICD-10-CM

## 2022-04-03 DIAGNOSIS — I5023 Acute on chronic systolic (congestive) heart failure: Secondary | ICD-10-CM | POA: Diagnosis present

## 2022-04-03 DIAGNOSIS — I110 Hypertensive heart disease with heart failure: Secondary | ICD-10-CM | POA: Diagnosis present

## 2022-04-03 DIAGNOSIS — J189 Pneumonia, unspecified organism: Secondary | ICD-10-CM | POA: Diagnosis not present

## 2022-04-03 DIAGNOSIS — Z8249 Family history of ischemic heart disease and other diseases of the circulatory system: Secondary | ICD-10-CM

## 2022-04-03 DIAGNOSIS — E785 Hyperlipidemia, unspecified: Secondary | ICD-10-CM | POA: Diagnosis present

## 2022-04-03 DIAGNOSIS — T380X5A Adverse effect of glucocorticoids and synthetic analogues, initial encounter: Secondary | ICD-10-CM | POA: Diagnosis not present

## 2022-04-03 DIAGNOSIS — I10 Essential (primary) hypertension: Secondary | ICD-10-CM | POA: Diagnosis not present

## 2022-04-03 DIAGNOSIS — I5043 Acute on chronic combined systolic (congestive) and diastolic (congestive) heart failure: Secondary | ICD-10-CM | POA: Diagnosis present

## 2022-04-03 DIAGNOSIS — Y92239 Unspecified place in hospital as the place of occurrence of the external cause: Secondary | ICD-10-CM | POA: Diagnosis not present

## 2022-04-03 DIAGNOSIS — I428 Other cardiomyopathies: Secondary | ICD-10-CM | POA: Diagnosis present

## 2022-04-03 DIAGNOSIS — I509 Heart failure, unspecified: Principal | ICD-10-CM

## 2022-04-03 DIAGNOSIS — Z7982 Long term (current) use of aspirin: Secondary | ICD-10-CM

## 2022-04-03 DIAGNOSIS — E871 Hypo-osmolality and hyponatremia: Secondary | ICD-10-CM | POA: Diagnosis present

## 2022-04-03 DIAGNOSIS — Z811 Family history of alcohol abuse and dependence: Secondary | ICD-10-CM

## 2022-04-03 DIAGNOSIS — J1282 Pneumonia due to coronavirus disease 2019: Secondary | ICD-10-CM | POA: Diagnosis present

## 2022-04-03 DIAGNOSIS — U071 COVID-19: Secondary | ICD-10-CM | POA: Diagnosis present

## 2022-04-03 DIAGNOSIS — Z823 Family history of stroke: Secondary | ICD-10-CM

## 2022-04-03 DIAGNOSIS — I493 Ventricular premature depolarization: Secondary | ICD-10-CM | POA: Diagnosis not present

## 2022-04-03 DIAGNOSIS — Z95810 Presence of automatic (implantable) cardiac defibrillator: Secondary | ICD-10-CM | POA: Diagnosis not present

## 2022-04-03 DIAGNOSIS — Z8349 Family history of other endocrine, nutritional and metabolic diseases: Secondary | ICD-10-CM

## 2022-04-03 LAB — EXPECTORATED SPUTUM ASSESSMENT W GRAM STAIN, RFLX TO RESP C

## 2022-04-03 LAB — COMPREHENSIVE METABOLIC PANEL
ALT: 39 U/L (ref 0–44)
AST: 40 U/L (ref 15–41)
Albumin: 3.6 g/dL (ref 3.5–5.0)
Alkaline Phosphatase: 81 U/L (ref 38–126)
Anion gap: 9 (ref 5–15)
BUN: 13 mg/dL (ref 8–23)
CO2: 20 mmol/L — ABNORMAL LOW (ref 22–32)
Calcium: 8.2 mg/dL — ABNORMAL LOW (ref 8.9–10.3)
Chloride: 107 mmol/L (ref 98–111)
Creatinine, Ser: 0.87 mg/dL (ref 0.61–1.24)
GFR, Estimated: >60 mL/min (ref >60)
Glucose, Bld: 110 mg/dL — ABNORMAL HIGH (ref 70–99)
Potassium: 4 mmol/L (ref 3.5–5.1)
Sodium: 136 mmol/L (ref 135–145)
Total Bilirubin: 1.3 mg/dL — ABNORMAL HIGH (ref 0.3–1.2)
Total Protein: 6.7 g/dL (ref 6.5–8.1)

## 2022-04-03 LAB — CBC WITH DIFFERENTIAL/PLATELET
Abs Immature Granulocytes: 0.07 10*3/uL (ref 0.00–0.07)
Basophils Absolute: 0.1 10*3/uL (ref 0.0–0.1)
Basophils Relative: 1 %
Eosinophils Absolute: 0.1 10*3/uL (ref 0.0–0.5)
Eosinophils Relative: 1 %
HCT: 44.3 % (ref 39.0–52.0)
Hemoglobin: 14.4 g/dL (ref 13.0–17.0)
Immature Granulocytes: 1 %
Lymphocytes Relative: 10 %
Lymphs Abs: 1.5 10*3/uL (ref 0.7–4.0)
MCH: 31.4 pg (ref 26.0–34.0)
MCHC: 32.5 g/dL (ref 30.0–36.0)
MCV: 96.5 fL (ref 80.0–100.0)
Monocytes Absolute: 0.9 10*3/uL (ref 0.1–1.0)
Monocytes Relative: 6 %
Neutro Abs: 11.8 10*3/uL — ABNORMAL HIGH (ref 1.7–7.7)
Neutrophils Relative %: 81 %
Platelets: 216 10*3/uL (ref 150–400)
RBC: 4.59 MIL/uL (ref 4.22–5.81)
RDW: 13.6 % (ref 11.5–15.5)
WBC: 14.3 10*3/uL — ABNORMAL HIGH (ref 4.0–10.5)
nRBC: 0 % (ref 0.0–0.2)

## 2022-04-03 LAB — TROPONIN I (HIGH SENSITIVITY)
Troponin I (High Sensitivity): 7 ng/L (ref <18)
Troponin I (High Sensitivity): 9 ng/L (ref <18)

## 2022-04-03 LAB — RESP PANEL BY RT-PCR (FLU A&B, COVID) ARPGX2
Influenza A by PCR: NEGATIVE
Influenza B by PCR: NEGATIVE
SARS Coronavirus 2 by RT PCR: POSITIVE — AB

## 2022-04-03 LAB — PROCALCITONIN: Procalcitonin: <0.1 ng/mL

## 2022-04-03 LAB — BRAIN NATRIURETIC PEPTIDE: B Natriuretic Peptide: 883.3 pg/mL — ABNORMAL HIGH (ref 0.0–100.0)

## 2022-04-03 LAB — STREP PNEUMONIAE URINARY ANTIGEN: Strep Pneumo Urinary Antigen: NEGATIVE

## 2022-04-03 MED ORDER — ASPIRIN EC 81 MG PO TBEC
81.0000 mg | DELAYED_RELEASE_TABLET | Freq: Every day | ORAL | Status: DC
  Administered 2022-04-03 – 2022-04-08 (×6): 81 mg via ORAL
  Filled 2022-04-03 (×6): qty 1

## 2022-04-03 MED ORDER — ONDANSETRON HCL 4 MG/2ML IJ SOLN
4.0000 mg | Freq: Three times a day (TID) | INTRAMUSCULAR | Status: DC | PRN
  Administered 2022-04-05: 4 mg via INTRAVENOUS
  Filled 2022-04-03: qty 2

## 2022-04-03 MED ORDER — METHYLPREDNISOLONE SODIUM SUCC 125 MG IJ SOLR
80.0000 mg | INTRAMUSCULAR | Status: DC
Start: 2022-04-03 — End: 2022-04-06
  Administered 2022-04-03 – 2022-04-05 (×3): 80 mg via INTRAVENOUS
  Filled 2022-04-03 (×3): qty 2

## 2022-04-03 MED ORDER — METOPROLOL SUCCINATE ER 100 MG PO TB24
100.0000 mg | ORAL_TABLET | Freq: Two times a day (BID) | ORAL | Status: DC
  Filled 2022-04-03: qty 1

## 2022-04-03 MED ORDER — METOPROLOL SUCCINATE ER 50 MG PO TB24
50.0000 mg | ORAL_TABLET | Freq: Two times a day (BID) | ORAL | Status: DC
  Administered 2022-04-03 – 2022-04-08 (×10): 50 mg via ORAL
  Filled 2022-04-03 (×10): qty 1

## 2022-04-03 MED ORDER — METOPROLOL SUCCINATE ER 50 MG PO TB24: 50.0000 mg | ORAL_TABLET | Freq: Two times a day (BID) | ORAL | Status: DC

## 2022-04-03 MED ORDER — ALBUTEROL SULFATE (2.5 MG/3ML) 0.083% IN NEBU
3.0000 mL | INHALATION_SOLUTION | RESPIRATORY_TRACT | Status: DC | PRN
Start: 2022-04-03 — End: 2022-04-03
  Administered 2022-04-03: 3 mL via RESPIRATORY_TRACT
  Filled 2022-04-03: qty 3

## 2022-04-03 MED ORDER — METHYLPREDNISOLONE SODIUM SUCC 40 MG IJ SOLR: 40.0000 mg | Freq: Two times a day (BID) | INTRAMUSCULAR | Status: DC

## 2022-04-03 MED ORDER — IPRATROPIUM-ALBUTEROL 0.5-2.5 (3) MG/3ML IN SOLN
3.0000 mL | Freq: Four times a day (QID) | RESPIRATORY_TRACT | Status: DC
  Administered 2022-04-03: 3 mL via RESPIRATORY_TRACT
  Filled 2022-04-03: qty 3

## 2022-04-03 MED ORDER — NIRMATRELVIR/RITONAVIR (PAXLOVID)TABLET
3.0000 | ORAL_TABLET | Freq: Two times a day (BID) | ORAL | Status: DC
  Administered 2022-04-03 – 2022-04-07 (×9): 3 via ORAL
  Filled 2022-04-03: qty 30

## 2022-04-03 MED ORDER — IPRATROPIUM-ALBUTEROL 20-100 MCG/ACT IN AERS
1.0000 | INHALATION_SPRAY | Freq: Four times a day (QID) | RESPIRATORY_TRACT | Status: DC
  Administered 2022-04-03 – 2022-04-08 (×19): 1 via RESPIRATORY_TRACT
  Filled 2022-04-03: qty 4

## 2022-04-03 MED ORDER — HYDRALAZINE HCL 20 MG/ML IJ SOLN: 5.0000 mg | INTRAMUSCULAR | Status: DC | PRN

## 2022-04-03 MED ORDER — SODIUM CHLORIDE 0.9 % IV SOLN
2.0000 g | INTRAVENOUS | Status: DC
  Administered 2022-04-03: 2 g via INTRAVENOUS
  Filled 2022-04-03 (×2): qty 20

## 2022-04-03 MED ORDER — ACETAMINOPHEN 325 MG PO TABS: 650.0000 mg | ORAL_TABLET | Freq: Four times a day (QID) | ORAL | Status: DC | PRN

## 2022-04-03 MED ORDER — ALBUTEROL SULFATE HFA 108 (90 BASE) MCG/ACT IN AERS
2.0000 | INHALATION_SPRAY | RESPIRATORY_TRACT | Status: DC | PRN
  Administered 2022-04-05 – 2022-04-06 (×2): 2 via RESPIRATORY_TRACT
  Filled 2022-04-03: qty 6.7

## 2022-04-03 MED ORDER — SOTALOL HCL 80 MG PO TABS
120.0000 mg | ORAL_TABLET | Freq: Two times a day (BID) | ORAL | Status: DC
  Administered 2022-04-03 – 2022-04-08 (×10): 120 mg via ORAL
  Filled 2022-04-03 (×11): qty 1.5

## 2022-04-03 MED ORDER — FUROSEMIDE 10 MG/ML IJ SOLN
40.0000 mg | Freq: Every day | INTRAMUSCULAR | Status: DC
  Administered 2022-04-04 – 2022-04-06 (×3): 40 mg via INTRAVENOUS
  Filled 2022-04-03 (×3): qty 4

## 2022-04-03 MED ORDER — ENOXAPARIN SODIUM 40 MG/0.4ML IJ SOSY
40.0000 mg | PREFILLED_SYRINGE | Freq: Every day | INTRAMUSCULAR | Status: DC
  Administered 2022-04-03 – 2022-04-07 (×5): 40 mg via SUBCUTANEOUS
  Filled 2022-04-03 (×5): qty 0.4

## 2022-04-03 MED ORDER — FUROSEMIDE 10 MG/ML IJ SOLN
40.0000 mg | Freq: Once | INTRAMUSCULAR | Status: AC
  Administered 2022-04-03: 40 mg via INTRAVENOUS
  Filled 2022-04-03: qty 4

## 2022-04-03 MED ORDER — ADULT MULTIVITAMIN W/MINERALS CH
1.0000 | ORAL_TABLET | Freq: Every day | ORAL | Status: DC
  Administered 2022-04-03 – 2022-04-08 (×6): 1 via ORAL
  Filled 2022-04-03 (×6): qty 1

## 2022-04-03 MED ORDER — LISINOPRIL 10 MG PO TABS
5.0000 mg | ORAL_TABLET | Freq: Every day | ORAL | Status: DC
Start: 2022-04-03 — End: 2022-04-03

## 2022-04-03 MED ORDER — DM-GUAIFENESIN ER 30-600 MG PO TB12
1.0000 | ORAL_TABLET | Freq: Two times a day (BID) | ORAL | Status: DC | PRN
Start: 2022-04-03 — End: 2022-04-08
  Administered 2022-04-04 – 2022-04-06 (×2): 1 via ORAL
  Filled 2022-04-03 (×2): qty 1

## 2022-04-03 MED ORDER — ORAL CARE MOUTH RINSE
15.0000 mL | Freq: Two times a day (BID) | OROMUCOSAL | Status: DC
  Administered 2022-04-03 – 2022-04-08 (×10): 15 mL via OROMUCOSAL

## 2022-04-03 MED ORDER — SODIUM CHLORIDE 0.9 % IV SOLN
500.0000 mg | INTRAVENOUS | Status: DC
  Administered 2022-04-03: 500 mg via INTRAVENOUS
  Filled 2022-04-03 (×2): qty 5

## 2022-04-03 NOTE — Assessment & Plan Note (Addendum)
2D echo on 01/24/2020 showed EF 20%.  Patient has trace leg edema, elevated BNP 883, interstitial edema chest x-ray, positive JVD, crackles on auscultation, clinically consistent with CHF exacerbation. ?-Lasix 40 mg daily ?

## 2022-04-03 NOTE — ED Provider Notes (Signed)
? ?[facility] ?Provider Note ? ? ? Event Date/Time  ? First MD Initiated Contact with Patient 04/03/22 1226   ?  (approximate) ? ? ?History  ? ?Shortness of Breath ? ? ?HPI ? ?Corey Zhang is a 76 y.o. male with CHF, VTE with history of ICD firing who comes in with concerns for fluid overload.  Patient comes in today for shortness of breath.  Oxygen levels 88% on room air.  Patient reports that after he got the oxygen on he started feeling a lot better.  He denies any falls, hitting his head.  Denies any chest pain or feeling his ICD go off.  He reports having ablation about a year ago and since then has had no ICD firings.  He denies any abdominal pain.  He reports no swelling in his legs.  He reports stable weight but does report that if he lays flat he feels more short of breath.  Does report worsening cough as well.  No unilateral leg swelling or history of blood clots. ? ? ? ?I reviewed the admission note from 08/07/2020 where patient has a history of V-fib with ICD.  Previously on amiodarone but unable to tolerate.  Currently on metoprolol.  Nonischemic cardiomyopathy. ? ?Physical Exam  ? ?Triage Vital Signs: ?ED Triage Vitals  ?Enc Vitals Group  ?   BP 04/03/22 1028 108/70  ?   Pulse Rate 04/03/22 1028 78  ?   Resp 04/03/22 1028 18  ?   Temp 04/03/22 1028 98.2 ?F (36.8 ?C)  ?   Temp Source 04/03/22 1028 Oral  ?   SpO2 04/03/22 1028 97 %  ?   Weight --   ?   Height --   ?   Head Circumference --   ?   Peak Flow --   ?   Pain Score 04/03/22 0938 0  ?   Pain Loc --   ?   Pain Edu? --   ?   Excl. in [place]? --   ? ? ?Most recent vital signs: ?Vitals:  ? 04/03/22 1028  ?BP: 108/70  ?Pulse: 78  ?Resp: 18  ?Temp: 98.2 ?F (36.8 ?C)  ?SpO2: 97%  ? ? ? ?General: Awake, no distress.  ?CV:  Good peripheral perfusion.  ?Resp:  Normal effort.  Clear lungs ?Abd:  No distention.  ?Other:  No swelling in legs.  No calf tenderness ? ? ?ED Results / Procedures / Treatments  ? ?Labs ?(all labs ordered are listed,  but only abnormal results are displayed) ?Labs Reviewed  ?CBC WITH DIFFERENTIAL/PLATELET - Abnormal; Notable for the following components:  ?    Result Value  ? WBC 14.3 (*)   ? Neutro Abs 11.8 (*)   ? All other components within normal limits  ?COMPREHENSIVE METABOLIC PANEL - Abnormal; Notable for the following components:  ? CO2 20 (*)   ? Glucose, Bld 110 (*)   ? Calcium 8.2 (*)   ? Total Bilirubin 1.3 (*)   ? All other components within normal limits  ?BRAIN NATRIURETIC PEPTIDE  ?PROCALCITONIN  ?TROPONIN I (HIGH SENSITIVITY)  ?TROPONIN I (HIGH SENSITIVITY)  ? ? ? ?EKG ? ?My interpretation of EKG: ? 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 ? ?RADIOLOGY ?I have reviewed the xray personally \\with  some bilateral infiltrates concerning for infection versus edema ? ?PROCEDURES: ? ?  Critical Care performed: Yes, see critical care procedure note(s) ? ?.1-3 Lead EKG Interpretation ?Performed by: Vanessa Forest, MD ?Authorized by: Vanessa Boley, MD  ? ?  Interpretation: normal   ?  ECG rate assessment: normal   ?  Rhythm: sinus rhythm   ?  Ectopy: none   ?  Conduction: normal   ?.Critical Care ?Performed by: Vanessa Prosper, MD ?Authorized by: Vanessa [place], MD  ? ?Critical care provider statement:  ?  Critical care time (minutes):  30 ?  Critical care was necessary to treat or prevent imminent or life-threatening deterioration of the following conditions:  Respiratory failure ?  Critical care was time spent personally by me on the following activities:  Development of treatment plan with patient or surrogate, discussions with consultants, evaluation of patient's response to treatment, examination of patient, ordering and review of laboratory studies, ordering and review of radiographic studies, ordering and performing treatments and interventions, pulse oximetry, re-evaluation of patient's condition and  review of old charts ? ? ?MEDICATIONS ORDERED IN ED: ?Medications  ?cefTRIAXone (ROCEPHIN) 2 g in sodium chloride 0.9 % 100 mL IVPB (0 g Intravenous Stopped 04/03/22 1331)  ?azithromycin (ZITHROMAX) 500 mg in sodium chloride 0.9 % 250 mL IVPB (0 mg Intravenous Stopped 04/03/22 1449)  ?albuterol (PROVENTIL) (2.5 MG/3ML) 0.083% nebulizer solution 3 mL (has no administration in time range)  ?ipratropium-albuterol (DUONEB) 0.5-2.5 (3) MG/3ML nebulizer solution 3 mL (3 mLs Nebulization Given 04/03/22 1344)  ?dextromethorphan-guaiFENesin (MUCINEX DM) 30-600 MG per 12 hr tablet 1 tablet (has no administration in time range)  ?ondansetron (ZOFRAN) injection 4 mg (has no administration in time range)  ?acetaminophen (TYLENOL) tablet 650 mg (has no administration in time range)  ?hydrALAZINE (APRESOLINE) injection 5 mg (has no administration in time range)  ?enoxaparin (LOVENOX) injection 40 mg (has no administration in time range)  ?aspirin EC tablet 81 mg (has no administration in time range)  ?sotalol (BETAPACE) tablet 120 mg (has no administration in time range)  ?multivitamin with minerals tablet 1 tablet (has no administration in time range)  ?metoprolol succinate (TOPROL-XL) 24 hr tablet 100 mg (has no administration in time range)  ?methylPREDNISolone sodium succinate (SOLU-MEDROL) 125 mg/2 mL injection 80 mg (has no administration in time range)  ?furosemide (LASIX) injection 40 mg (40 mg Intravenous Given 04/03/22 1343)  ? ? ? ?IMPRESSION / MDM / ASSESSMENT AND PLAN / ED COURSE  ?I reviewed the triage vital signs and the nursing notes. ? ?Patient comes in hypoxic and so placed on 2 L with x-ray concerning for infection versus edema. ? ? ?CBC shows elevated white count which could be consistent with possible infection but his vitals are otherwise normal does not meet sepsis criteria.  His CMP is stable.  His troponin is negative. ? ?Given patient's hypoxia I placed patient on antibiotics to cover for pneumonia as well as  given some diuresis for possible CHF and will admit to the hospital team ? ? ?The patient is on the cardiac monitor to evaluate for evidence of arrhythmia and/or significant heart rate changes. ? ?  ? ? ?FINAL CLINICAL IMPRESSION(S) / ED DIAGNOSES  ? ?Final diagnoses:  ?Acute on chronic congestive heart failure, unspecified heart failure type ([place])  ?Acute respiratory failure with hypoxia ([place])  ?Community acquired pneumonia, unspecified laterality  ? ? ? ?Rx / DC Orders  ? ?ED Discharge Orders   ? ? None  ? ?  ? ? ? ?Note:  This document was prepared using  [employer] training and education officer and may include unintentional dictation errors. ?  ?Vanessa [place], MD ?04/03/22 1529 ? ?

## 2022-04-03 NOTE — Assessment & Plan Note (Signed)
-  Patient is on metoprolol and sotalol ?

## 2022-04-03 NOTE — Assessment & Plan Note (Signed)
Patient has positive COVID test.  Had oxygen desaturating to 82% on room air, which improved to 97% on 2 L oxygen.  Chest x-ray showed left lower lobe pneumonia.  Patient also has leukocytosis with WBC 14.3, cannot completely rule out bacterial pneumonia, CAP.  Will start Paxlovid and antibiotics.  Patient does not meet criteria for sepsis ? ? ?- Will admit to tele med bed as inpt ?- IV Rocephin and azithromycin ?- Paxlovid ?- Solu-Medrol 40 mg twice daily ?- Mucinex for cough  ?- Bronchodilators ?- Urine legionella and S. pneumococcal antigen ?- Follow up blood culture x2, sputum culture ? ? ? ?

## 2022-04-03 NOTE — Progress Notes (Signed)
Cross Cover ?Metoprolol dose adjusted to home dose 50 mg BID ?

## 2022-04-03 NOTE — Assessment & Plan Note (Signed)
-   Hold lisinopril since patient at risk of developing hypotension after starting IV Lasix ?-Continue metoprolol and sotalol ?-IV hydralazine as needed ?

## 2022-04-03 NOTE — ED Notes (Signed)
Antibiotics already given at time this Rn assumed care for pt. Blood cultures not obtained prior to administration. Provider messaged.  ?

## 2022-04-03 NOTE — ED Triage Notes (Signed)
Pt comes into the ED via EMS from home with c/o SOB, 88%RA, denies pain, 2L [place] 95%. ? ?134/88 ?CBG210 ?AV40 ? ?

## 2022-04-03 NOTE — Assessment & Plan Note (Signed)
Patient is not taking medications currently -Follow-up with PCP 

## 2022-04-03 NOTE — H&P (Signed)
?History and Physical  ? ? ?Corey Zhang [facility] [id number] DOB: [birth year] DOA: 04/03/2022 ? ?Referring MD/NP/PA:  ? ?PCP: Adin Hector, MD  ? ?Patient coming from:  The patient is coming from home.  At baseline, pt is independent for most of ADL.       ? ?Chief Complaint: Cough, shortness breath ? ?HPI: Corey Zhang is a 76 y.o. 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 ? ?Patient states that he has cough and shortness of breath for several days, he coughs up little mucus.  No chest pain, denies fever or chills.  Patient has nausea, dry heaves, no vomiting, diarrhea or abdominal pain.  No symptoms of UTI.  Patient also reports orthopnea, states that his shortness breath is worse on laying down. ? ?Data Reviewed and ED Course: pt was found to have positive COVID test, WBC 14.3, BNP 883, troponin level 7, temperature normal, blood pressure 108/70, heart rate 78, RR 18, oxygen saturation 88% on room air, which improved to 97% on 2 L oxygen.  Chest x-ray showed left lower lobe infiltration and interstitial pulmonary edema.  Patient is admitted to telemetry bed as inpatient ? ? ?EKG: I have personally reviewed.  Sinus rhythm, QTc 486, LAE, low voltage, LAD, poor R wave progression, T wave inversion in V5-V6. ? ? ?Review of Systems:  ? ?General: no fevers, chills, no body weight gain, has fatigue ?HEENT: no blurry vision, hearing changes or sore throat ?Respiratory: has dyspnea, coughing, no wheezing ?CV: no chest pain, no palpitations ?GI: has nausea, no vomiting, abdominal pain, diarrhea, constipation ?GU: no dysuria, burning on urination, increased urinary frequency, hematuria  ?Ext: has trace leg edema ?Neuro: no unilateral weakness, numbness, or tingling, no vision change or hearing loss ?Skin: no rash, no skin tear. ?MSK: No muscle spasm, no deformity, no limitation of range of movement in  spin ?Heme: No easy bruising.  ?Travel history: No recent long distant travel. ? ? ?Allergy:  ?Allergies  ?Allergen Reactions  ? Amiodarone Other (See Comments)  ?  Hurts liver ?Liver and thyroid panels went up, also shaky  ? Other Swelling  ?  Ant venom  ? ? ?Past Medical History:  ?Diagnosis Date  ? Cardiomyopathy ([place])   ? CHF (congestive heart failure) ([place])   ? Hyperlipidemia   ? Hypertension   ? ? ?Past Surgical History:  ?Procedure Laterality Date  ? defribrillator implant    ? ? ?Social History:  reports that he has never smoked. He has never used smokeless tobacco. He reports that he does not currently use alcohol. He reports that he does not currently use drugs. ? ?Family History:  ?Family History  ?Problem Relation Age of Onset  ? Skin cancer Mother   ? Thyroid disease Mother   ? CAD Father   ? Stroke Father   ? Alcohol abuse Father   ? CAD Brother   ? Heart attack Brother   ? Hyperlipidemia Brother   ?  ? ?Prior to Admission medications   ?Medication Sig Start Date End Date Taking? Authorizing Provider  ?acetaminophen (TYLENOL) 500 MG tablet Take 1,000 mg by mouth as needed.    [provider]  ?aspirin 81 MG EC tablet Take 81 mg by mouth daily.    [provider]  ?lisinopril (ZESTRIL) 10 MG tablet Take 5 mg by mouth daily. 06/05/20   [provider]  ?lisinopril (  ZESTRIL) 5 MG tablet Take 5 mg by mouth daily. 01/14/22   [provider]  ?melatonin 5 MG TABS Take 5 mg by mouth at bedtime as needed for sleep.    [provider]  ?metoprolol succinate (TOPROL-XL) 100 MG 24 hr tablet Take 100 mg by mouth 2 (two) times daily. 07/20/20   [provider]  ?Multiple Vitamin (MULTI-VITAMIN) tablet Take 1 tablet by mouth daily.    [provider]  ?sotalol (BETAPACE) 120 MG tablet Take 120 mg by mouth 2 (two) times daily. 03/01/22   [provider]  ? ? ?Physical Exam: ?Vitals:  ? 04/03/22 1028 04/03/22 1500 04/03/22 1600 04/03/22 1734  ?BP:  108/70  99/79 120/76  ?Pulse: 78  (!) 101 100  ?Resp: 18  19 18   ?Temp: 98.2 ?F (36.8 ?C)   98.5 ?F (36.9 ?C)  ?TempSrc: Oral     ?SpO2: 97%  92% 90%  ?Height:  5\' 8"  (1.727 m)    ? ?General: Not in acute distress ?HEENT: ?      Eyes: PERRL, EOMI, no scleral icterus. ?      ENT: No discharge from the ears and nose, no pharynx injection, no tonsillar enlargement.  ?      Neck: positive JVD, no bruit, no mass felt. ?Heme: No neck lymph node enlargement. ?Cardiac: S1/S2, RRR, No murmurs, No gallops or rubs. ?Respiratory: has crackles bilaterally ?GI: Soft, nondistended, nontender, no rebound pain, no organomegaly, BS present. ?GU: No hematuria ?Ext: has trace leg edema bilaterally. 1+DP/PT pulse bilaterally. ?Musculoskeletal: No joint deformities, No joint redness or warmth, no limitation of ROM in spin. ?Skin: No rashes.  ?Neuro: Alert, oriented X3, cranial nerves II-XII grossly intact, moves all extremities normally.  ?Psych: Patient is not psychotic, no suicidal or hemocidal ideation. ? ?Labs on Admission: I have personally reviewed following labs and imaging studies ? ?CBC: ?Recent Labs  ?Lab 04/03/22 ?1032  ?WBC 14.3*  ?NEUTROABS 11.8*  ?HGB 14.4  ?HCT 44.3  ?MCV 96.5  ?PLT 216  ? ?Basic Metabolic Panel: ?Recent Labs  ?Lab 04/03/22 ?1032  ?NA 136  ?K 4.0  ?CL 107  ?CO2 20*  ?GLUCOSE 110*  ?BUN 13  ?CREATININE 0.87  ?CALCIUM 8.2*  ? ?GFR: ?CrCl cannot be calculated (Unknown ideal weight.). ?Liver Function Tests: ?Recent Labs  ?Lab 04/03/22 ?1032  ?AST 40  ?ALT 39  ?ALKPHOS 81  ?BILITOT 1.3*  ?PROT 6.7  ?ALBUMIN 3.6  ? ?No results for input(s): LIPASE, AMYLASE in the last 168 hours. ?No results for input(s): AMMONIA in the last 168 hours. ?Coagulation Profile: ?No results for input(s): INR, PROTIME in the last 168 hours. ?Cardiac Enzymes: ?No results for input(s): CKTOTAL, CKMB, CKMBINDEX, TROPONINI in the last 168 hours. ?BNP (last 3 results) ?No results for input(s): PROBNP in the last 8760 hours. ?HbA1C: ?No  results for input(s): HGBA1C in the last 72 hours. ?CBG: ?No results for input(s): GLUCAP in the last 168 hours. ?Lipid Profile: ?No results for input(s): CHOL, HDL, LDLCALC, TRIG, CHOLHDL, LDLDIRECT in the last 72 hours. ?Thyroid Function Tests: ?No results for input(s): TSH, T4TOTAL, FREET4, T3FREE, THYROIDAB in the last 72 hours. ?Anemia Panel: ?No results for input(s): VITAMINB12, FOLATE, FERRITIN, TIBC, IRON, RETICCTPCT in the last 72 hours. ?Urine analysis: ?   ?Component Value Date/Time  ? COLORURINE Yellow 12/31/2014 1805  ? APPEARANCEUR Clear 12/31/2014 1805  ? LABSPEC 1.017 12/31/2014 1805  ? PHURINE 5.0 12/31/2014 1805  ? GLUCOSEU Negative 12/31/2014 1805  ? HGBUR  Negative 12/31/2014 1805  ? BILIRUBINUR Negative 12/31/2014 1805  ? KETONESUR Negative 12/31/2014 1805  ? PROTEINUR 30 mg/dL 12/31/2014 1805  ? NITRITE Negative 12/31/2014 1805  ? LEUKOCYTESUR Negative 12/31/2014 1805  ? ?Sepsis Labs: ?@LABRCNTIP (procalcitonin:4,lacticidven:4) ?) ?Recent Results (from the past 240 hour(s))  ?Resp Panel by RT-PCR (Flu A&B, Covid) Nasopharyngeal Swab     Status: Abnormal  ? Collection Time: 04/03/22 12:56 PM  ? Specimen: Nasopharyngeal Swab; Nasopharyngeal(NP) swabs in vial transport medium  ?Result Value Ref Range Status  ? SARS Coronavirus 2 by RT PCR POSITIVE (A) NEGATIVE Final  ?  Comment: (NOTE) ?SARS-CoV-2 target nucleic acids are DETECTED. ? ?The SARS-CoV-2 RNA is generally detectable in upper respiratory ?specimens during the acute phase of infection. Positive results are ?indicative of the presence of the identified virus, but do not rule ?out bacterial infection or co-infection with other pathogens not ?detected by the test. Clinical correlation with patient history and ?other diagnostic information is necessary to determine patient ?infection status. The expected result is Negative. ? ?Fact Sheet for Patients: ?EntrepreneurPulse.com.au ? ?Fact Sheet for Healthcare  Providers: ?IncredibleEmployment.be ? ?This test is not yet approved or cleared by the [place] FDA and  ?has been authorized for detection and/or diagnosis of SARS-CoV-2 by ?FDA under an Emergency Use Autho

## 2022-04-04 ENCOUNTER — Inpatient Hospital Stay
Admit: 2022-04-04 | Discharge: 2022-04-04 | Disposition: A | Discharge status: Home / Self Care | Payer: Medicare Other | Attending: Internal Medicine | Admitting: Internal Medicine

## 2022-04-04 DIAGNOSIS — J1282 Pneumonia due to coronavirus disease 2019: Secondary | ICD-10-CM | POA: Diagnosis not present

## 2022-04-04 DIAGNOSIS — I10 Essential (primary) hypertension: Secondary | ICD-10-CM | POA: Diagnosis not present

## 2022-04-04 DIAGNOSIS — I5023 Acute on chronic systolic (congestive) heart failure: Secondary | ICD-10-CM | POA: Diagnosis not present

## 2022-04-04 DIAGNOSIS — U071 COVID-19: Secondary | ICD-10-CM

## 2022-04-04 LAB — ECHOCARDIOGRAM COMPLETE
AR max vel: 2.4 cm2
AV Area VTI: 2.23 cm2
AV Area mean vel: 2.04 cm2
AV Mean grad: 2 mmHg
AV Peak grad: 3.5 mmHg
Ao pk vel: 0.94 m/s
Area-P 1/2: 4.89 cm2
Height: 68 in
MV VTI: 1.7 cm2
S' Lateral: 5.6 cm
Weight: 2462.1 oz

## 2022-04-04 LAB — BASIC METABOLIC PANEL
Anion gap: 10 (ref 5–15)
BUN: 17 mg/dL (ref 8–23)
CO2: 22 mmol/L (ref 22–32)
Calcium: 8.7 mg/dL — ABNORMAL LOW (ref 8.9–10.3)
Chloride: 103 mmol/L (ref 98–111)
Creatinine, Ser: 0.97 mg/dL (ref 0.61–1.24)
GFR, Estimated: >60 mL/min (ref >60)
Glucose, Bld: 152 mg/dL — ABNORMAL HIGH (ref 70–99)
Potassium: 3.7 mmol/L (ref 3.5–5.1)
Sodium: 135 mmol/L (ref 135–145)

## 2022-04-04 LAB — PROCALCITONIN: Procalcitonin: <0.1 ng/mL

## 2022-04-04 LAB — MAGNESIUM: Magnesium: 2.2 mg/dL (ref 1.7–2.4)

## 2022-04-04 NOTE — Progress Notes (Signed)
Sats 94-98% on 4Lnc overnight. In morning, decreased O2 to 3Lnc while RN in room. Sats decreased to 88-91% (pt denies SOB) - increased back to 4Lnc to keep sats >92%.  ?

## 2022-04-04 NOTE — Progress Notes (Addendum)
?  Progress Note ? ? ?Patient: Corey Zhang [id number] DOB: 1946-01-15 DOA: 04/03/2022     1 ?DOS: the patient was seen and examined on 04/04/2022 ?  ?Brief hospital course: ?Corey Zhang is a 76 y.o. 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 ?He developed hypoxemia, was placed on 2 L oxygen.  COVID test was positive.  Patient also has elevation BNP, chest x-ray showed left lower lobe infiltrates and interstitial pulm edema. ?Patient is treated with Paxlovid, Solu-Medrol as well as IV Lasix. ? ?Assessment and Plan: ?Acute hypoxemic respiratory failure secondary to pneumonia and congestive heart failure exacerbation. ?COVID-19 viral pneumonia. ?We will continue Solu-Medrol and Paxlovid.  Patient procalcitonin level less than 0.1, no evidence of bacterial pneumonia.  Discontinue antibiotics.  Continue oxygen treatment. ? ?Acute on chronic combined systolic and diastolic congestive heart failure. 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 ?Moderate mitral regurgitation. ?Reviewed echocardiogram performed today showed ejection fraction less than 20%, moderate mitral regurgitation with grade 3 diastolic dysfunction ?Patient still has some volume overload, will continue IV Lasix. ?We will consult cardiology. ? ?Essential hypertension. ?Continue metoprolol. ? ?  ? ?Subjective:  ?Patient still on 3 L oxygen, short of breath with exertion.  He also had paroxysmal nocturnal dyspnea the prior night. ? ?Physical Exam: ?Vitals:  ? 04/04/22 0430 04/04/22 0800 04/04/22 0900 04/04/22 1000  ?BP:  118/77 122/81   ?Pulse: 81 87 80   ?Resp: (!) 22 18 (!) 22   ?Temp:   98 ?F (36.7 ?C)   ?TempSrc:   Oral   ?SpO2: 96% 94% 93% 95%  ?Weight:      ?Height:      ? ?General exam: Appears calm and comfortable  ?Respiratory system: Clear to auscultation. Respiratory effort normal. ?Cardiovascular  system: S1 & S2 heard, RRR. No JVD, murmurs, rubs, gallops or clicks. No pedal edema. ?Gastrointestinal system: Abdomen is nondistended, soft and nontender. No organomegaly or masses felt. Normal bowel sounds heard. ?Central nervous system: Alert and oriented. No focal neurological deficits. ?Extremities: Symmetric 5 x 5 power. ?Skin: No rashes, lesions or ulcers ?Psychiatry: Judgement and insight appear normal. Mood & affect appropriate.  ? ?Data Reviewed: ? ?Reviewed chest x-ray and lab results. ? ?Family Communication: daughter updated ?Disposition: ?Status is: Inpatient ?Remains inpatient appropriate because: severity of disease, IV treatment ? Planned Discharge Destination: Home ? ? ? ?Time spent: 35 minutes ? ?Author: ?Sharen Hones, MD ?04/04/2022 1:09 PM ? ?For on call review [email or web address].  ?

## 2022-04-04 NOTE — Hospital Course (Signed)
Corey Zhang is a 76 y.o. 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 ?He developed hypoxemia, was placed on 2 L oxygen.  COVID test was positive.  Patient also has elevation BNP, chest x-ray showed left lower lobe infiltrates and interstitial pulm edema. ?Patient is treated with Paxlovid, Solu-Medrol as well as IV Lasix. ?

## 2022-04-04 NOTE — Progress Notes (Signed)
*  PRELIMINARY RESULTS* ?Echocardiogram ?2D Echocardiogram has been performed. ? ?Corey Zhang ?04/04/2022, 7:57 AM ?

## 2022-04-04 NOTE — TOC Initial Note (Signed)
Transition of Care (TOC) - Initial/Assessment Note  ? ? ?Patient Details  ?Name: HIAWATHA MERRIOTT ?MRN: [id number] ?Date of Birth: 12-23-1945 ? ?Transition of Care (TOC) CM/SW Contact:    ?Truddie Hidden, RN ?Phone Number: ?04/04/2022, 1:36 PM ? ?Clinical Narrative:                 ? ?Transition of Care (TOC) Screening Note ? ? ?Patient Details  ?Name: MARCELLA DUNNAWAY ?Date of Birth: 1946/11/20 ? ? ?Transition of Care (TOC) CM/SW Contact:    ?Truddie Hidden, RN ?Phone Number: ?04/04/2022, 1:36 PM ? ? ? ?Transition of Care Department [facility]) has reviewed patient and no TOC needs have been identified at this time. We will continue to monitor patient advancement through interdisciplinary progression rounds. If new patient transition needs arise, please place a TOC consult. ?  ? ?  ?  ? ? ?Patient Goals and CMS Choice ?  ?  ?  ? ?Expected Discharge Plan and Services ?  ?  ?  ?  ?  ?                ?  ?  ?  ?  ?  ?  ?  ?  ?  ?  ? ?Prior Living Arrangements/Services ?  ?  ?  ?       ?  ?  ?  ?  ? ?Activities of Daily Living ?Home Assistive Devices/Equipment: None ?ADL Screening (condition at time of admission) ?Patient's cognitive ability adequate to safely complete daily activities?: Yes ?Is the patient deaf or have difficulty hearing?: No ?Does the patient have difficulty seeing, even when wearing glasses/contacts?: No ?Does the patient have difficulty concentrating, remembering, or making decisions?: No ?Patient able to express need for assistance with ADLs?: No ?Does the patient have difficulty dressing or bathing?: No ?Independently performs ADLs?: Yes (appropriate for developmental age) ?Does the patient have difficulty walking or climbing stairs?: No ?Weakness of Legs: None ?Weakness of Arms/Hands: None ? ?Permission Sought/Granted ?  ?  ?   ?   ?   ?   ? ?Emotional Assessment ?  ?  ?  ?  ?  ?  ? ?Admission diagnosis:  Acute respiratory failure with hypoxia (HCC) [J96.01] ?Acute on chronic systolic CHF (congestive heart failure)  (HCC) [I50.23] ?Community acquired pneumonia, unspecified laterality [J18.9] ?Acute on chronic congestive heart failure, unspecified heart failure type (HCC) [I50.9] ?Patient Active Problem List  ? Diagnosis Date Noted  ? Acute on chronic systolic CHF (congestive heart failure) (HCC) 04/03/2022  ? CAP (community acquired pneumonia) 04/03/2022  ? Pneumonia due to COVID-19 virus 04/03/2022  ? Hypertension   ? Hyperlipidemia   ? Ventricular arrhythmia 08/07/2020  ? ?PCP:  Lynnea Ferrier, MD ?Pharmacy:   ?[id number] - [place], [place] - [address] AT [facility] OF [address] ?[address] ?[place] [address] ?Phone: [phone] Fax: [phone] ? ? ? ? ?Social Determinants of Health (SDOH) Interventions ?  ? ?Readmission Risk Interventions ?   ? View : No data to display.  ?  ?  ?  ? ? ? ?

## 2022-04-04 NOTE — Consult Note (Signed)
[facility] Cardiology ? ?CARDIOLOGY CONSULT NOTE  ?Patient ID: ?Corey Zhang ?MRN: [id number] ?DOB/AGE: 1946/07/18 76 y.o. ? ?Admit date: 04/03/2022 ?Referring Physician Sharen Hones ?Primary Physician Adin Hector, MD ?Primary Cardiologist Miquel Dunn ?Reason for Consultation AoCHF ? 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  Cardiology was consulted for evaluation of his heart failure. ? ?Prior to his admission to the hospital on 04/03/2022 he described several days of increased cough and shortness of breath.  His cough is productive of some mucus, but he denies any fevers or chills.  He was having some nausea but no active vomiting or other GI symptoms. ? ?Since arrival to the hospital he has been hemodynamically stable, but requiring supplemental nasal cannula oxygen to maintain his saturation.  His labs are notable for a white blood cell count of 14.3, and a normal creatinine.  His BNP was elevated to 880 with no prior for comparison.  Troponin is negative.  His COVID PCR is positive. ? ?At the time of my evaluation he is feeling improved, but still short of breath compared to baseline. He denies any LE edema or weight gain, but a predominant symptom prior to presentation was difficulty breathing while laying flat.  ? ? ? ?Review of systems complete and found to be negative unless listed above  ? ? ? ?Past Medical History:  ?Diagnosis Date  ? Cardiomyopathy ([place])   ? CHF (congestive heart failure) ([place])   ? Hyperlipidemia   ? Hypertension   ?  ?Past Surgical History:  ?Procedure Laterality Date  ? defribrillator implant    ?  ?Medications Prior to Admission  ?Medication Sig Dispense Refill Last Dose  ? acetaminophen (TYLENOL) 500 MG tablet Take 1,000 mg by mouth as needed.   04/02/2022  ? aspirin  81 MG EC tablet Take 81 mg by mouth daily.   04/02/2022  ? lisinopril (ZESTRIL) 5 MG tablet Take 5 mg by mouth daily.   04/02/2022  ? metoprolol succinate (TOPROL-XL) 100 MG 24 hr tablet Take 100 mg by mouth 2 (two) times daily.   04/03/2022  ? Multiple Vitamin (MULTI-VITAMIN) tablet Take 1 tablet by mouth daily.   04/02/2022  ? sotalol (BETAPACE) 120 MG tablet Take 120 mg by mouth 2 (two) times daily.   04/03/2022  ? lisinopril (ZESTRIL) 10 MG tablet Take 5 mg by mouth daily. (Patient not taking: Reported on 04/03/2022)   Not Taking  ? melatonin 5 MG TABS Take 5 mg by mouth at bedtime as needed for sleep. (Patient not taking: Reported on 04/03/2022)   Not Taking  ? ?Social History  ? ?Socioeconomic History  ? Marital status: Widowed  ?  Spouse name: Not on file  ? Number of children: Not on file  ? Years of education: Not on file  ? Highest education level: Not on file  ?Occupational History  ? Not on file  ?Tobacco Use  ? Smoking status: Never  ? Smokeless tobacco: Never  ?Vaping Use  ? Vaping Use: Never used  ?Substance and Sexual Activity  ? Alcohol use: Not Currently  ? Drug use: Not Currently  ? Sexual activity: Not on file  ?Other Topics Concern  ? Not on file  ?Social History Narrative  ? Not on file  ? ?Social  Determinants of Health  ? ?Financial Resource Strain: Not on file  ?Food Insecurity: Not on file  ?Transportation Needs: Not on file  ?Physical Activity: Not on file  ?Stress: Not on file  ?Social Connections: Not on file  ?Intimate Partner Violence: Not on file  ?  ?Family History  ?Problem Relation Age of Onset  ? Skin cancer Mother   ? Thyroid disease Mother   ? CAD Father   ? Stroke Father   ? Alcohol abuse Father   ? CAD Brother   ? Heart attack Brother   ? Hyperlipidemia Brother   ?  ? ? ?Review of systems complete and found to be negative unless listed above  ? ? ? ? ?PHYSICAL EXAM ? ?General: Well developed, well nourished, in no acute distress. [place] oxygen in place ?HEENT:  Normocephalic and atramatic ?Neck:   No JVD.  ?Lungs: Crackles in bl bases. ?Heart: HRRR . Normal S1 and S2 without gallops or murmurs.  ?Abdomen: Bowel sounds are positive, abdomen soft and non-tender  ?Msk:  Back normal, normal gait. Normal strength and tone for age. ?Extremities: No clubbing, cyanosis or edema.   ?Neuro: Alert and oriented X 3. ?Psych:  Good affect, responds appropriately ? ?Labs: ?  ?Lab Results  ?Component Value Date  ? WBC 14.3 (H) 04/03/2022  ? HGB 14.4 04/03/2022  ? HCT 44.3 04/03/2022  ? MCV 96.5 04/03/2022  ? PLT 216 04/03/2022  ?  ?Recent Labs  ?Lab 04/03/22 ?1032 04/04/22 ?0530  ?NA 136 135  ?K 4.0 3.7  ?CL 107 103  ?CO2 20* 22  ?BUN 13 17  ?CREATININE 0.87 0.97  ?CALCIUM 8.2* 8.7*  ?PROT 6.7  --   ?BILITOT 1.3*  --   ?ALKPHOS 81  --   ?ALT 39  --   ?AST 40  --   ?GLUCOSE 110* 152*  ? ?Lab Results  ?Component Value Date  ? CKTOTAL 125 12/31/2014  ? CKMB 15.0 (H) 01/01/2015  ? TROPONINI <0.03 03/04/2015  ?  ?Lab Results  ?Component Value Date  ? CHOL 165 01/01/2015  ? ?Lab Results  ?Component Value Date  ? HDL 25 (L) 01/01/2015  ? ?Lab Results  ?Component Value Date  ? LDLCALC 114 (H) 01/01/2015  ? ?Lab Results  ?Component Value Date  ? TRIG 132 01/01/2015  ? ?No results found for: CHOLHDL ?No results found for: LDLDIRECT  ?  ?Radiology: DG Chest 2 View ? ?Result Date: 04/03/2022 ?CLINICAL DATA:  A 76 year old male presents for evaluation of shortness of breath. EXAM: CHEST - 2 VIEW COMPARISON:  December 31, 2014. FINDINGS: Mild blunting of LEFT costodiaphragmatic sulcus may be due to scarring, no signs of effusion on lateral projection. Cardiac pacer defibrillator projects over the cardiac silhouette with power pack over the LEFT chest. Trachea midline. Cardiomediastinal contours and hilar structures are normal. Mild increased interstitial markings throughout the chest without signs of lobar consolidation but with airspace disease along the fissure likely in the LEFT chest and partially obscured LEFT hemidiaphragm along its  medial aspect. On limited assessment there is no acute skeletal finding. IMPRESSION: LEFT lower lobe airspace disease and may represent atelectasis or infection. Mild increased interstitial markings could reflect pulmonary edema or atypical infection. Electronically Signed   By: Zetta Bills M.D.   On: 04/03/2022 10:06  ? ?ECHOCARDIOGRAM COMPLETE ? ?Result Date: 04/04/2022 ?   ECHOCARDIOGRAM REPORT   Patient Name:   Ottis E Vallecillo Date of Exam: 04/04/2022 Medical Rec #:  [id number]  Height:       68.0 in Accession #:    [id number]  Weight:       153.9 lb Date of Birth:  1946/10/16   BSA:          1.828 m? Patient Age:    59 years    BP:           107/70 mmHg Patient Gender: M           HR:           84 bpm. Exam Location:  [facility] Procedure: 2D Echo, Color Doppler and Cardiac Doppler Indications:     I50.21 congestive heart failure-Acute Systolic  History:         Patient has no prior history of Echocardiogram examinations.                  CHF; Risk Factors:Hypertension and Dyslipidemia. Pt tested                  positive for COVID-19 on 04/03/22.  Sonographer:     Charmayne Sheer Referring Phys:  Unknown Foley NIU Diagnosing Phys: Donnelly Angelica  Sonographer Comments: Suboptimal apical window and suboptimal subcostal window. Image acquisition challenging due to respiratory motion. IMPRESSIONS  1. Left ventricular ejection fraction, by estimation, is <20%. The left ventricle has severely decreased function. The left ventricle demonstrates global hypokinesis. The left ventricular internal cavity size was moderately to severely dilated. Left ventricular diastolic parameters are consistent with Grade III diastolic dysfunction (restrictive).  2. Right ventricular systolic function is normal. The right ventricular size is normal.  3. Left atrial size was mildly dilated.  4. The mitral valve is normal in structure. Moderate mitral valve regurgitation. No evidence of mitral stenosis.  5. The aortic valve is grossly normal. Aortic valve  regurgitation is not visualized. No aortic stenosis is present.  6. The inferior vena cava is dilated in size with <50% respiratory variability, suggesting right atrial pressure of 15 mmHg. FINDINGS  Left Lawson Fiscal

## 2022-04-05 DIAGNOSIS — J1282 Pneumonia due to coronavirus disease 2019: Secondary | ICD-10-CM | POA: Diagnosis not present

## 2022-04-05 DIAGNOSIS — U071 COVID-19: Secondary | ICD-10-CM | POA: Diagnosis not present

## 2022-04-05 DIAGNOSIS — I5023 Acute on chronic systolic (congestive) heart failure: Secondary | ICD-10-CM | POA: Diagnosis not present

## 2022-04-05 DIAGNOSIS — I499 Cardiac arrhythmia, unspecified: Secondary | ICD-10-CM | POA: Diagnosis not present

## 2022-04-05 DIAGNOSIS — K219 Gastro-esophageal reflux disease without esophagitis: Secondary | ICD-10-CM

## 2022-04-05 LAB — GLUCOSE, CAPILLARY: Glucose-Capillary: 144 mg/dL — ABNORMAL HIGH (ref 70–99)

## 2022-04-05 LAB — BASIC METABOLIC PANEL
Anion gap: 10 (ref 5–15)
BUN: 29 mg/dL — ABNORMAL HIGH (ref 8–23)
CO2: 23 mmol/L (ref 22–32)
Calcium: 9 mg/dL (ref 8.9–10.3)
Chloride: 100 mmol/L (ref 98–111)
Creatinine, Ser: 1.01 mg/dL (ref 0.61–1.24)
GFR, Estimated: >60 mL/min (ref >60)
Glucose, Bld: 125 mg/dL — ABNORMAL HIGH (ref 70–99)
Potassium: 3.9 mmol/L (ref 3.5–5.1)
Sodium: 133 mmol/L — ABNORMAL LOW (ref 135–145)

## 2022-04-05 LAB — PROCALCITONIN: Procalcitonin: 0.13 ng/mL

## 2022-04-05 LAB — MAGNESIUM: Magnesium: 2.5 mg/dL — ABNORMAL HIGH (ref 1.7–2.4)

## 2022-04-05 MED ORDER — PANTOPRAZOLE SODIUM 40 MG PO TBEC
40.0000 mg | DELAYED_RELEASE_TABLET | Freq: Every day | ORAL | Status: DC
  Administered 2022-04-05 – 2022-04-08 (×4): 40 mg via ORAL
  Filled 2022-04-05 (×4): qty 1

## 2022-04-05 NOTE — Plan of Care (Signed)
?  Problem: Education: ?Goal: Ability to verbalize understanding of medication therapies will improve ?Outcome: Progressing ?  ?Problem: Activity: ?Goal: Capacity to carry out activities will improve ?Outcome: Progressing ?  ?Problem: Cardiac: ?Goal: Ability to achieve and maintain adequate cardiopulmonary perfusion will improve ?Outcome: Progressing ?  ?

## 2022-04-05 NOTE — Consult Note (Signed)
[facility] Cardiology ? ?CARDIOLOGY CONSULT NOTE  ?Patient ID: ?Corey KRIPPNER ?MRN: [id number] ?DOB/AGE: [birth year] 76 y.o. ? ?Admit date: 04/03/2022 ?Referring Physician Sharen Hones ?Primary Physician Adin Hector, MD ?Primary Cardiologist Miquel Dunn ?Reason for Consultation AoCHF ? 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  Cardiology was consulted for evaluation of his heart failure. ? ?Interval history: ?- Felt shaky overnight with some nausea and pink tinged sputum.  ?- This morning feels better than yesterday. Continues to describe some orthopnea, but his weight remains at his baseline.  ? ? ?Review of systems complete and found to be negative unless listed above  ? ? ? ?Past Medical History:  ?Diagnosis Date  ? Cardiomyopathy ([place])   ? CHF (congestive heart failure) ([place])   ? Hyperlipidemia   ? Hypertension   ?  ?Past Surgical History:  ?Procedure Laterality Date  ? defribrillator implant    ?  ?Medications Prior to Admission  ?Medication Sig Dispense Refill Last Dose  ? acetaminophen (TYLENOL) 500 MG tablet Take 1,000 mg by mouth as needed.   04/02/2022  ? aspirin 81 MG EC tablet Take 81 mg by mouth daily.   04/02/2022  ? lisinopril (ZESTRIL) 5 MG tablet Take 5 mg by mouth daily.   04/02/2022  ? metoprolol succinate (TOPROL-XL) 100 MG 24 hr tablet Take 100 mg by mouth 2 (two) times daily.   04/03/2022  ? Multiple Vitamin (MULTI-VITAMIN) tablet Take 1 tablet by mouth daily.   04/02/2022  ? sotalol (BETAPACE) 120 MG tablet Take 120 mg by mouth 2 (two) times daily.   04/03/2022  ? lisinopril (ZESTRIL) 10 MG tablet Take 5 mg by mouth daily. (Patient not taking: Reported on 04/03/2022)   Not Taking  ? melatonin 5 MG TABS Take 5 mg by mouth at bedtime as needed for sleep. (Patient not taking: Reported on  04/03/2022)   Not Taking  ? ?Social History  ? ?Socioeconomic History  ? Marital status: Widowed  ?  Spouse name: Not on file  ? Number of children: Not on file  ? Years of education: Not on file  ? Highest education level: Not on file  ?Occupational History  ? Not on file  ?Tobacco Use  ? Smoking status: Never  ? Smokeless tobacco: Never  ?Vaping Use  ? Vaping Use: Never used  ?Substance and Sexual Activity  ? Alcohol use: Not Currently  ? Drug use: Not Currently  ? Sexual activity: Not on file  ?Other Topics Concern  ? Not on file  ?Social History Narrative  ? Not on file  ? ?Social Determinants of Health  ? ?Financial Resource Strain: Not on file  ?Food Insecurity: Not on file  ?Transportation Needs: Not on file  ?Physical Activity: Not on file  ?Stress: Not on file  ?Social Connections: Not on file  ?Intimate Partner Violence: Not on file  ?  ?Family History  ?Problem Relation Age of Onset  ? Skin cancer Mother   ? Thyroid disease Mother   ? CAD Father   ? Stroke Father   ? Alcohol abuse Father   ? CAD Brother   ? Heart attack Brother   ? Hyperlipidemia Brother   ?  ? ? ?Review of systems complete and found to be negative unless  listed above  ? ? ? ? ?PHYSICAL EXAM ? ?General: Well developed, well nourished, in no acute distress. [place] oxygen in place ?HEENT:  Normocephalic and atramatic ?Neck:  No JVD.  ?Lungs: Crackles in bl bases. ?Heart: HRRR . Normal S1 and S2 without gallops or murmurs.  ?Abdomen: Bowel sounds are positive, abdomen soft and non-tender  ?Msk:  Back normal, normal gait. Normal strength and tone for age. ?Extremities: No clubbing, cyanosis or edema.   ?Neuro: Alert and oriented X 3. ?Psych:  Good affect, responds appropriately ? ?Labs: ?  ?Lab Results  ?Component Value Date  ? WBC 14.3 (H) 04/03/2022  ? HGB 14.4 04/03/2022  ? HCT 44.3 04/03/2022  ? MCV 96.5 04/03/2022  ? PLT 216 04/03/2022  ?  ?Recent Labs  ?Lab 04/03/22 ?1032 04/04/22 ?0530 04/05/22 ?0703  ?NA 136   < > 133*  ?K 4.0   < > 3.9   ?CL 107   < > 100  ?CO2 20*   < > 23  ?BUN 13   < > 29*  ?CREATININE 0.87   < > 1.01  ?CALCIUM 8.2*   < > 9.0  ?PROT 6.7  --   --   ?BILITOT 1.3*  --   --   ?ALKPHOS 81  --   --   ?ALT 39  --   --   ?AST 40  --   --   ?GLUCOSE 110*   < > 125*  ? < > = values in this interval not displayed.  ? ? ?Lab Results  ?Component Value Date  ? CKTOTAL 125 12/31/2014  ? CKMB 15.0 (H) 01/01/2015  ? TROPONINI <0.03 03/04/2015  ? ?  ?Lab Results  ?Component Value Date  ? CHOL 165 01/01/2015  ? ?Lab Results  ?Component Value Date  ? HDL 25 (L) 01/01/2015  ? ?Lab Results  ?Component Value Date  ? LDLCALC 114 (H) 01/01/2015  ? ?Lab Results  ?Component Value Date  ? TRIG 132 01/01/2015  ? ?No results found for: CHOLHDL ?No results found for: LDLDIRECT  ?  ?Radiology: DG Chest 2 View ? ?Result Date: 04/03/2022 ?CLINICAL DATA:  A 76 year old male presents for evaluation of shortness of breath. EXAM: CHEST - 2 VIEW COMPARISON:  December 31, 2014. FINDINGS: Mild blunting of LEFT costodiaphragmatic sulcus may be due to scarring, no signs of effusion on lateral projection. Cardiac pacer defibrillator projects over the cardiac silhouette with power pack over the LEFT chest. Trachea midline. Cardiomediastinal contours and hilar structures are normal. Mild increased interstitial markings throughout the chest without signs of lobar consolidation but with airspace disease along the fissure likely in the LEFT chest and partially obscured LEFT hemidiaphragm along its medial aspect. On limited assessment there is no acute skeletal finding. IMPRESSION: LEFT lower lobe airspace disease and may represent atelectasis or infection. Mild increased interstitial markings could reflect pulmonary edema or atypical infection. Electronically Signed   By: Donzetta Kohut M.D.   On: 04/03/2022 10:06  ? ?ECHOCARDIOGRAM COMPLETE ? ?Result Date: 04/04/2022 ?   ECHOCARDIOGRAM REPORT   Patient Name:   Corey Zhang Date of Exam: 04/04/2022 Medical Rec #:  [id number]   Height:        68.0 in Accession #:    [id number]  Weight:       153.9 lb Date of Birth:  1946/04/29   BSA:          1.828 m? Patient Age:    7 years  BP:           107/70 mmHg Patient Gender: M           HR:           84 bpm. Exam Location:  [facility] Procedure: 2D Echo, Color Doppler and Cardiac Doppler Indications:     I50.21 congestive heart failure-Acute Systolic  History:         Patient has no prior history of Echocardiogram examinations.                  CHF; Risk Factors:Hypertension and Dyslipidemia. Pt tested                  positive for COVID-19 on 04/03/22.  Sonographer:     Charmayne Sheer Referring Phys:  Unknown Foley NIU Diagnosing Phys: Donnelly Angelica  Sonographer Comments: Suboptimal apical window and suboptimal subcostal window. Image acquisition challenging due to respiratory motion. IMPRESSIONS  1. Left ventricular ejection fraction, by estimation, is <20%. The left ventricle has severely decreased function. The left ventricle demonstrates global hypokinesis. The left ventricular internal cavity size was moderately to severely dilated. Left ventricular diastolic parameters are consistent with Grade III diastolic dysfunction (restrictive).  2. Right ventricular systolic function is normal. The right ventricular size is normal.  3. Left atrial size was mildly dilated.  4. The mitral valve is normal in structure. Moderate mitral valve regurgitation. No evidence of mitral stenosis.  5. The aortic valve is grossly normal. Aortic valve regurgitation is not visualized. No aortic stenosis is present.  6. The inferior vena cava is dilated in size with <50% respiratory variability, suggesting right atrial pressure of 15 mmHg. FINDINGS  Left Ventricle: Left ventricular ejection fraction, by estimation, is <20%. The left ventricle has severely decreased function. The left ventricle demonstrates global hypokinesis. The left ventricular internal cavity size was moderately to severely dilated. There is no left ventricular hypertrophy.  Left ventricular diastolic parameters are consistent with Grade III diastolic dysfunction (restrictive). Right Ventricle: The right ventricular size is normal. Right vetricular wall thickness was not well v

## 2022-04-05 NOTE — Progress Notes (Addendum)
Patient called this nurse to room saying that he woke up feeling really shaky. CHG taken 144 at this time. Patient alert and oriented x 4. Makes needs known. Vitals WNL. Patient assisted in bed for comfort. Advised patient that he is to let me know if he gets to feeling worse. Denies pain, denies difficulty breathing. On 02 at 3 L, sats 95% via nasal cannula. Continues to cough up thin pink tinged phlegm. No apparent distress. Will continue to observe.  ? ?0208- Patient assisted to bathroom. Had medium brown stool. Complaints of some nausea. Zofran given. Patient states while resting on side of bed that he feels a little better. Vitals- 106/73, HR-78, 100%on 02at 3L, 19 resp. 97.13F oral ? ?

## 2022-04-05 NOTE — Progress Notes (Signed)
?  Progress Note ? ? ?Patient: Corey Zhang [id number] DOB: 1946/10/24 DOA: 04/03/2022     2 ?DOS: the patient was seen and examined on 04/05/2022 ?  ?Brief hospital course: ?FOLEY ARWOOD is a 76 y.o. 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 ?He developed hypoxemia, was placed on 2 L oxygen.  COVID test was positive.  Patient also has elevation BNP, chest x-ray showed left lower lobe infiltrates and interstitial pulm edema. ?Patient is treated with Paxlovid, Solu-Medrol as well as IV Lasix. ? ?Assessment and Plan: ?Acute hypoxemic respiratory failure secondary to pneumonia and congestive heart failure exacerbation. ?COVID-19 viral pneumonia. ?Patient still has some short of breath with exertion, paroxysmal nocturnal dyspnea.  Continue Paxlovid and Solu-Medrol. ?Wean oxygen. ? ?Acute on chronic combined systolic and diastolic congestive heart failure. 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 ?Moderate mitral regurgitation. ?Reviewed echocardiogram performed today showed ejection fraction less than 20%, moderate mitral regurgitation with grade 3 diastolic dysfunction ?Urine output was not recorded appropriately, patient had more urine output. ?Still has significant hypoxemia and a paroxysmal active dyspnea.  Still volume overload.  Continue gentle diuresis. ?  ?Essential hypertension. ?Continue metoprolol. ?  ? ? ?  ? ?Subjective:  ?Patient could not sleep last night due to bedside alarm.  Still has paroxysmal active dyspnea and short of breath with exertion.  Cough up some pink mucus. ? ?Physical Exam: ?Vitals:  ? 04/05/22 0115 04/05/22 0400 04/05/22 0500 04/05/22 0816  ?BP: 108/77 111/83  118/77  ?Pulse: 84 78  76  ?Resp: (!) 21 17  17   ?Temp:  (!) 97.5 ?F (36.4 ?C)  97.8 ?F (36.6 ?C)  ?TempSrc:  Oral  Oral  ?SpO2: 91% 93%  93%  ?Weight:   68.7 kg   ?Height:       ? ?General exam: Appears calm and comfortable  ?Respiratory system: Decreased breathing sounds with a few crackles in the base. Respiratory effort normal. ?Cardiovascular system: S1 & S2 heard, RRR. No JVD, murmurs, rubs, gallops or clicks. No pedal edema. ?Gastrointestinal system: Abdomen is nondistended, soft and nontender. No organomegaly or masses felt. Normal bowel sounds heard. ?Central nervous system: Alert and oriented. No focal neurological deficits. ?Extremities: Symmetric 5 x 5 power. ?Skin: No rashes, lesions or ulcers ?Psychiatry: Judgement and insight appear normal. Mood & affect appropriate.  ? ?Data Reviewed: ? ?Results reviewed ? ?Family Communication: daughter updated ? ?Disposition: ?Status is: Inpatient ?Remains inpatient appropriate because: Severity of disease, IV treatment. ? Planned Discharge Destination: Home with Home Health ? ? ? ?Time spent: 32 minutes ? ?Author: ?Sharen Hones, MD ?04/05/2022 12:01 PM ? ?For on call review [email or web address].  ?

## 2022-04-05 NOTE — Evaluation (Signed)
Clinical/Bedside Swallow Evaluation ?Patient Details  ?Name: Corey Zhang ?MRN: [id number] ?Date of Birth: 04/26/1946 ? ?Today's Date: 04/05/2022 ?Time: SLP Start Time (ACUTE ONLY): 0850 SLP Stop Time (ACUTE ONLY): 0950 ?SLP Time Calculation (min) (ACUTE ONLY): 60 min ? ?Past Medical History:  ?Past Medical History:  ?Diagnosis Date  ? Cardiomyopathy (HCC)   ? CHF (congestive heart failure) (HCC)   ? Hyperlipidemia   ? Hypertension   ? ?Past Surgical History:  ?Past Surgical History:  ?Procedure Laterality Date  ? defribrillator implant    ? ?HPI:  ?Pt is a 76 y.o. male with medical history significant of c/o REFLUX s/s, hypertension, hyperlipidemia, CHF with EF of 20%, ventricular arrhythmia, ICD placement, who presents with cough, shortness breath.  Patient states that he has cough and shortness of breath for several days, he coughs up little mucus.  Patient has nausea, dry heaves, no vomiting, diarrhea or abdominal pain.  Patient also reports orthopnea, states that his shortness breath is worse on laying down.   Chest x-ray showed left lower lobe infiltration and interstitial pulmonary edema.  Pt is COVID Positive.  ?  ?Assessment / Plan / Recommendation  ?Clinical Impression ? Pt seen for BSE today. He has a Baseline of CHF w/ 20% EF. Pt also describes s/s REFLUX behaviors. He is Not on a PPI and has not been seen by a GI for such. Pt A/O x4. Verbal and engaged easily w/ this Clinician.  ? ?Pt appears to present w/ adequate oropharyngeal phase swallow function w/ No oropharyngeal phase dysphagia noted, No neuromuscular deficits noted. Pt consumed po trials w/ No overt, clinical s/s of aspiration during the po trials. Pt appears at reduced risk for aspiration from an oropharyngeal phase standpoint following general aspiration precautions. ?However, w/ ANY Esophageal Dysmotility or Regurgitation of Reflux material, it can increase risk for aspiration of the Reflux material during Retrograde flow thus impact Voicing and  Pulmonary status. Pt described ongoing issues w/ problematic foods such as meats, breads endorsing s/s of Globus, pointing to his mid-lower sternum area then described the discomfort moves superiorly. He feels this impacts his ability to take full meals and c/o early Satiety.  ? ?During po trials, pt consumed all consistencies w/ no overt coughing, decline in vocal quality, or change in respiratory presentation during/post trials. O2 sats remained 97%. Oral phase appeared [facility] w/ timely bolus management, mastication, and control of bolus propulsion for A-P transfer for swallowing. Oral clearing achieved w/ all trial consistencies. OM Exam appeared [facility] w/ no unilateral weakness noted. Speech Clear. Pt fed self w/ setup support.  ? ?Recommend a Regular consistency diet w/ well-Cut meats, moistened foods; Thin liquids via Cup w/ less straw use to lessen air swallowed. Recommend general aspiration precautions, REFLUX PRECAUTIONS. Pills 1 at a time w/ liquids vs whole in Puree for easier swallowing. Education given on Pills in Puree; food consistencies and easy to eat options; food prep; general aspiration precautions; REFLUX PRECAUTIONS. Handouts given. An Incentive Spirometer was given for pulmonary support/ex. ?Recommend f/u w/ both Dietician for diet education, and a GI consult post discharge to further assessment and/or manage REFLUX s/s. NSG to reconsult if any new needs arise. NSG agreed. ?SLP Visit Diagnosis: Dysphagia, unspecified (R13.10) ?   ?Aspiration Risk ?  (reduced following general precautions)  ?  ?Diet Recommendation   Regular consistency diet w/ well-Cut meats, moistened foods; Thin liquids via Cup w/ less straw use to lessen air swallowed. Recommend general aspiration precautions, REFLUX PRECAUTIONS. ? ?  Medication Administration: Whole meds with liquid (if easier for pill swallowing, use a Puree)  ?  ?Other  Recommendations Recommended Consults: Consider GI evaluation;Consider esophageal assessment  (for further assessment) ?Oral Care Recommendations: Oral care BID;Oral care before and after PO;Patient independent with oral care ?Other Recommendations:  (n/a)   ? ?Recommendations for follow up therapy are one component of a multi-disciplinary discharge planning process, led by the attending physician.  Recommendations may be updated based on patient status, additional functional criteria and insurance authorization. ? ?Follow up Recommendations No SLP follow up  ? ? ?  ?Assistance Recommended at Discharge None  ?Functional Status Assessment Patient has not had a recent decline in their functional status (re: swallowing)  ?Frequency and Duration  (n/a)  ? (n/a) ?  ?   ? ?Prognosis Prognosis for Safe Diet Advancement: Good ?Barriers to Reach Goals: Time post onset;Severity of deficits (REFLUX s/s) ?Barriers/Prognosis Comment: recommended f/u w/ GI and w/ a Dietician for assessment/management  ? ?  ? ?Swallow Study   ?General Date of Onset: 04/03/22 ?HPI: Pt is a 76 y.o. male with medical history significant of c/o REFLUX s/s, hypertension, hyperlipidemia, CHF with EF of 20%, ventricular arrhythmia, ICD placement, who presents with cough, shortness breath.  Patient states that he has cough and shortness of breath for several days, he coughs up little mucus.  Patient has nausea, dry heaves, no vomiting, diarrhea or abdominal pain.  Patient also reports orthopnea, states that his shortness breath is worse on laying down.   Chest x-ray showed left lower lobe infiltration and interstitial pulmonary edema.  Pt is COVID Positive. ?Type of Study: Bedside Swallow Evaluation ?Previous Swallow Assessment: none ?Diet Prior to this Study: Regular;Thin liquids (pt reports poor appetite/intake) ?Temperature Spikes Noted: No ?Respiratory Status: Nasal cannula (2-4L) ?History of Recent Intubation: No ?Behavior/Cognition: Alert;Cooperative;Pleasant mood ?Oral Cavity Assessment: Within Functional Limits ?Oral Care Completed by SLP:  Yes ?Oral Cavity - Dentition: Adequate natural dentition ?Vision: Functional for self-feeding ?Self-Feeding Abilities: Able to feed self ?Patient Positioning: Upright in bed ?Baseline Vocal Quality: Normal ?Volitional Cough: Strong ?Volitional Swallow: Able to elicit  ?  ?Oral/Motor/Sensory Function Overall Oral Motor/Sensory Function: Within functional limits   ?Ice Chips Ice chips: Within functional limits ?Presentation: Spoon (fed; 2 trials)   ?Thin Liquid Thin Liquid: Within functional limits ?Presentation: Cup;Self Fed;Straw (5-6 trials via each method)  ?  ?Nectar Thick Nectar Thick Liquid: Not tested   ?Honey Thick Honey Thick Liquid: Not tested   ?Puree Puree: Within functional limits ?Presentation: Self Fed;Spoon (10 boluses)   ?Solid ? ? ?  Solid: Within functional limits ?Presentation: Self Fed (5 trials)  ? ?  ? ? ? ? ?Jerilynn Som, MS, CCC-SLP ?Speech Language Pathologist ?Rehab Services; [facility] ?[phone] (ascom) ?Christos Mixson ?04/05/2022,11:13 AM ? ? ? ?

## 2022-04-06 DIAGNOSIS — U071 COVID-19: Secondary | ICD-10-CM | POA: Diagnosis not present

## 2022-04-06 DIAGNOSIS — J1282 Pneumonia due to coronavirus disease 2019: Secondary | ICD-10-CM | POA: Diagnosis not present

## 2022-04-06 DIAGNOSIS — I499 Cardiac arrhythmia, unspecified: Secondary | ICD-10-CM | POA: Diagnosis not present

## 2022-04-06 DIAGNOSIS — I5023 Acute on chronic systolic (congestive) heart failure: Secondary | ICD-10-CM | POA: Diagnosis not present

## 2022-04-06 LAB — CULTURE, RESPIRATORY W GRAM STAIN: Culture: NORMAL

## 2022-04-06 LAB — BASIC METABOLIC PANEL
Anion gap: 8 (ref 5–15)
BUN: 38 mg/dL — ABNORMAL HIGH (ref 8–23)
CO2: 24 mmol/L (ref 22–32)
Calcium: 8.4 mg/dL — ABNORMAL LOW (ref 8.9–10.3)
Chloride: 99 mmol/L (ref 98–111)
Creatinine, Ser: 1.05 mg/dL (ref 0.61–1.24)
GFR, Estimated: >60 mL/min (ref >60)
Glucose, Bld: 119 mg/dL — ABNORMAL HIGH (ref 70–99)
Potassium: 4.1 mmol/L (ref 3.5–5.1)
Sodium: 131 mmol/L — ABNORMAL LOW (ref 135–145)

## 2022-04-06 LAB — MAGNESIUM: Magnesium: 2.4 mg/dL (ref 1.7–2.4)

## 2022-04-06 MED ORDER — METHYLPREDNISOLONE SODIUM SUCC 40 MG IJ SOLR
40.0000 mg | INTRAMUSCULAR | Status: DC
Start: 2022-04-06 — End: 2022-04-08
  Administered 2022-04-06 – 2022-04-07 (×2): 40 mg via INTRAVENOUS
  Filled 2022-04-06 (×2): qty 1

## 2022-04-06 NOTE — Progress Notes (Signed)
?  Progress Note ? ? ?Patient: Corey Zhang [id number] DOB: 1946/09/17 DOA: 04/03/2022     3 ?DOS: the patient was seen and examined on 04/06/2022 ?  ?Brief hospital course: ?KOLLIN RAAB is a 76 y.o. 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 ?He developed hypoxemia, was placed on 2 L oxygen.  COVID test was positive.  Patient also has elevation BNP, chest x-ray showed left lower lobe infiltrates and interstitial pulm edema. ?Patient is treated with Paxlovid, Solu-Medrol as well as IV Lasix. ? ?Assessment and Plan: ?Acute hypoxemic respiratory failure secondary to pneumonia and congestive heart failure exacerbation. ?COVID-19 viral pneumonia. ?Condition improving, still short of breath with exertion, still requiring 2-1/2 L oxygen.  Continue Paxlovid and Solu-Medrol. ?Wean oxygen. ? ?Acute on chronic combined systolic and diastolic congestive heart failure. 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 ?Moderate mitral regurgitation. ?Echocardiogram performed today showed ejection fraction less than 20%, moderate mitral regurgitation with grade 3 diastolic dysfunction ?Condition improving, cardiology has discontinued Lasix after today's dose.  We will reassess tomorrow if additional doses are needed. ?  ?Essential hypertension. ?Continue metoprolol. ? ? ?  ? ?Subjective:  ?Patient had some short of breath overnight.  Did not sleep well. ?Cough with white mucus.  No wheezing. ? ?Physical Exam: ?Vitals:  ? 04/06/22 0406 04/06/22 0500 04/06/22 0900 04/06/22 1241  ?BP: 110/79  113/77 121/83  ?Pulse: 78  79 82  ?Resp: 18  (!) 22 (!) 21  ?Temp: 98.7 ?F (37.1 ?C)  98.6 ?F (37 ?C) 98.7 ?F (37.1 ?C)  ?TempSrc: Oral  Oral Oral  ?SpO2: 95%  98% 97%  ?Weight:  69.1 kg    ?Height:      ? ?General exam: Appears calm and comfortable  ?Respiratory system: Clear to auscultation.  Respiratory effort normal. ?Cardiovascular system: S1 & S2 heard, RRR. No JVD, murmurs, rubs, gallops or clicks. No pedal edema. ?Gastrointestinal system: Abdomen is nondistended, soft and nontender. No organomegaly or masses felt. Normal bowel sounds heard. ?Central nervous system: Alert and oriented. No focal neurological deficits. ?Extremities: Symmetric 5 x 5 power. ?Skin: No rashes, lesions or ulcers ?Psychiatry: Judgement and insight appear normal. Mood & affect appropriate.  ? ?Data Reviewed: ? ?Lab results reviewed ? ?Family Communication: daughter updated ? ?Disposition: ?Status is: Inpatient ?Remains inpatient appropriate because: Severity of disease ? Planned Discharge Destination: Home with Home Health ? ? ? ?Time spent: 28 minutes ? ?Author: ?Sharen Hones, MD ?04/06/2022 2:22 PM ? ?For on call review [email or web address].  ?

## 2022-04-06 NOTE — Consult Note (Signed)
[facility] Cardiology ? ?CARDIOLOGY CONSULT NOTE  ?Patient ID: ?Corey Zhang ?MRN: [id number] ?DOB/AGE: [birth year] 76 y.o. ? ?Admit date: 04/03/2022 ?Referring Physician Sharen Hones ?Primary Physician Adin Hector, MD ?Primary Cardiologist Miquel Dunn ?Reason for Consultation AoCHF ? 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  Cardiology was consulted for evaluation of his heart failure. ? ?Interval history: ?- Good UOP overnight.  ?- Continues to have cough with blood tinge present.  ?- Does have some ongoing shortness of breath.  ? ? ?Review of systems complete and found to be negative unless listed above  ? ? ? ?Past Medical History:  ?Diagnosis Date  ? Cardiomyopathy ([place])   ? CHF (congestive heart failure) ([place])   ? Hyperlipidemia   ? Hypertension   ?  ?Past Surgical History:  ?Procedure Laterality Date  ? defribrillator implant    ?  ?Medications Prior to Admission  ?Medication Sig Dispense Refill Last Dose  ? acetaminophen (TYLENOL) 500 MG tablet Take 1,000 mg by mouth as needed.   04/02/2022  ? aspirin 81 MG EC tablet Take 81 mg by mouth daily.   04/02/2022  ? lisinopril (ZESTRIL) 5 MG tablet Take 5 mg by mouth daily.   04/02/2022  ? metoprolol succinate (TOPROL-XL) 100 MG 24 hr tablet Take 100 mg by mouth 2 (two) times daily.   04/03/2022  ? Multiple Vitamin (MULTI-VITAMIN) tablet Take 1 tablet by mouth daily.   04/02/2022  ? sotalol (BETAPACE) 120 MG tablet Take 120 mg by mouth 2 (two) times daily.   04/03/2022  ? lisinopril (ZESTRIL) 10 MG tablet Take 5 mg by mouth daily. (Patient not taking: Reported on 04/03/2022)   Not Taking  ? melatonin 5 MG TABS Take 5 mg by mouth at bedtime as needed for sleep. (Patient not taking: Reported on 04/03/2022)   Not Taking  ? ?Social History  ? ?Socioeconomic History   ? Marital status: Widowed  ?  Spouse name: Not on file  ? Number of children: Not on file  ? Years of education: Not on file  ? Highest education level: Not on file  ?Occupational History  ? Not on file  ?Tobacco Use  ? Smoking status: Never  ? Smokeless tobacco: Never  ?Vaping Use  ? Vaping Use: Never used  ?Substance and Sexual Activity  ? Alcohol use: Not Currently  ? Drug use: Not Currently  ? Sexual activity: Not on file  ?Other Topics Concern  ? Not on file  ?Social History Narrative  ? Not on file  ? ?Social Determinants of Health  ? ?Financial Resource Strain: Not on file  ?Food Insecurity: Not on file  ?Transportation Needs: Not on file  ?Physical Activity: Not on file  ?Stress: Not on file  ?Social Connections: Not on file  ?Intimate Partner Violence: Not on file  ?  ?Family History  ?Problem Relation Age of Onset  ? Skin cancer Mother   ? Thyroid disease Mother   ? CAD Father   ? Stroke Father   ? Alcohol abuse Father   ? CAD Brother   ? Heart attack Brother   ? Hyperlipidemia Brother   ?  ? ? ?Review of systems complete and found to be negative unless listed above  ? ? ? ? ?PHYSICAL  EXAM ? ?General: Well developed, well nourished, in no acute distress. Scurry oxygen in place ?HEENT:  Normocephalic and atramatic ?Neck:  No JVD.  ?Lungs: Crackles in bl bases. ?Heart: HRRR . Normal S1 and S2 without gallops or murmurs.  ?Abdomen: Bowel sounds are positive, abdomen soft and non-tender  ?Msk:  Back normal, normal gait. Normal strength and tone for age. ?Extremities: No clubbing, cyanosis or edema.   ?Neuro: Alert and oriented X 3. ?Psych:  Good affect, responds appropriately ? ?Labs: ?  ?Lab Results  ?Component Value Date  ? WBC 14.3 (H) 04/03/2022  ? HGB 14.4 04/03/2022  ? HCT 44.3 04/03/2022  ? MCV 96.5 04/03/2022  ? PLT 216 04/03/2022  ?  ?Recent Labs  ?Lab 04/03/22 ?1032 04/04/22 ?0530 04/05/22 ?0703  ?NA 136   < > 133*  ?K 4.0   < > 3.9  ?CL 107   < > 100  ?CO2 20*   < > 23  ?BUN 13   < > 29*  ?CREATININE  0.87   < > 1.01  ?CALCIUM 8.2*   < > 9.0  ?PROT 6.7  --   --   ?BILITOT 1.3*  --   --   ?ALKPHOS 81  --   --   ?ALT 39  --   --   ?AST 40  --   --   ?GLUCOSE 110*   < > 125*  ? < > = values in this interval not displayed.  ? ? ?Lab Results  ?Component Value Date  ? CKTOTAL 125 12/31/2014  ? CKMB 15.0 (H) 01/01/2015  ? TROPONINI <0.03 03/04/2015  ? ?  ?Lab Results  ?Component Value Date  ? CHOL 165 01/01/2015  ? ?Lab Results  ?Component Value Date  ? HDL 25 (L) 01/01/2015  ? ?Lab Results  ?Component Value Date  ? LDLCALC 114 (H) 01/01/2015  ? ?Lab Results  ?Component Value Date  ? TRIG 132 01/01/2015  ? ?No results found for: CHOLHDL ?No results found for: LDLDIRECT  ?  ?Radiology: DG Chest 2 View ? ?Result Date: 04/03/2022 ?CLINICAL DATA:  A 76 year old male presents for evaluation of shortness of breath. EXAM: CHEST - 2 VIEW COMPARISON:  December 31, 2014. FINDINGS: Mild blunting of LEFT costodiaphragmatic sulcus may be due to scarring, no signs of effusion on lateral projection. Cardiac pacer defibrillator projects over the cardiac silhouette with power pack over the LEFT chest. Trachea midline. Cardiomediastinal contours and hilar structures are normal. Mild increased interstitial markings throughout the chest without signs of lobar consolidation but with airspace disease along the fissure likely in the LEFT chest and partially obscured LEFT hemidiaphragm along its medial aspect. On limited assessment there is no acute skeletal finding. IMPRESSION: LEFT lower lobe airspace disease and may represent atelectasis or infection. Mild increased interstitial markings could reflect pulmonary edema or atypical infection. Electronically Signed   By: Zetta Bills M.D.   On: 04/03/2022 10:06  ? ?ECHOCARDIOGRAM COMPLETE ? ?Result Date: 04/04/2022 ?   ECHOCARDIOGRAM REPORT   Patient Name:   Corey Zhang Date of Exam: 04/04/2022 Medical Rec #:  [id number]   Height:       68.0 in Accession #:    [id number]  Weight:       153.9 lb Date  of Birth:  November 11, 1946   BSA:          1.828 m? Patient Age:    76 years    BP:  107/70 mmHg Patient Gender: M           HR:           84 bpm. Exam Location:  [facility] Procedure: 2D Echo, Color Doppler and Cardiac Doppler Indications:     I50.21 congestive heart failure-Acute Systolic  History:         Patient has no prior history of Echocardiogram examinations.                  CHF; Risk Factors:Hypertension and Dyslipidemia. Pt tested                  positive for COVID-19 on 04/03/22.  Sonographer:     Charmayne Sheer Referring Phys:  Unknown Foley NIU Diagnosing Phys: Donnelly Angelica  Sonographer Comments: Suboptimal apical window and suboptimal subcostal window. Image acquisition challenging due to respiratory motion. IMPRESSIONS  1. Left ventricular ejection fraction, by estimation, is <20%. The left ventricle has severely decreased function. The left ventricle demonstrates global hypokinesis. The left ventricular internal cavity size was moderately to severely dilated. Left ventricular diastolic parameters are consistent with Grade III diastolic dysfunction (restrictive).  2. Right ventricular systolic function is normal. The right ventricular size is normal.  3. Left atrial size was mildly dilated.  4. The mitral valve is normal in structure. Moderate mitral valve regurgitation. No evidence of mitral stenosis.  5. The aortic valve is grossly normal. Aortic valve regurgitation is not visualized. No aortic stenosis is present.  6. The inferior vena cava is dilated in size with <50% respiratory variability, suggesting right atrial pressure of 15 mmHg. FINDINGS  Left Ventricle: Left ventricular ejection fraction, by estimation, is <20%. The left ventricle has severely decreased function. The left ventricle demonstrates global hypokinesis. The left ventricular internal cavity size was moderately to severely dilated. There is no left ventricular hypertrophy. Left ventricular diastolic parameters are consistent with Grade III  diastolic dysfunction (restrictive). Right Ventricle: The right ventricular size is normal. Right vetricular wall thickness was not well visualized. Right ventricular systolic function is normal. Left A

## 2022-04-06 NOTE — Evaluation (Signed)
Physical Therapy Evaluation ?Patient Details ?Name: Corey Zhang ?MRN: [id number] ?DOB: 14-Oct-1946 ?Today's Date: 04/06/2022 ? ?History of Present Illness ? Pt is a 76 y.o. 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 MD assessment includes: acute hypoxemic respiratory failure secondary to pneumonia and congestive heart failure exacerbation, covid-19 viral pneumonia, and acute on chronic CHF. ?  ?Clinical Impression ? Pt was pleasant and motivated to participate during the session and put forth good effort throughout. Pt found on 2.5LO2/min with SpO2 97-98% at rest.  SpO2 monitored during graded ambulation and dropped to a low of 94% with pt reporting mild SOB that resolved quickly.  Pt reported feeling subjectively weaker and less steady than at baseline but was able to ambulate without an AD with slow cadence but without LOB.  Pt will benefit from HHPT upon discharge to safely address deficits listed in patient problem list for decreased caregiver assistance and eventual return to PLOF. ?  ?   ?   ? ?Recommendations for follow up therapy are one component of a multi-disciplinary discharge planning process, led by the attending physician.  Recommendations may be updated based on patient status, additional functional criteria and insurance authorization. ? ?Follow Up Recommendations Home health PT ? ?  ?Assistance Recommended at Discharge Intermittent Supervision/Assistance  ?Patient can return home with the following ? A little help with walking and/or transfers;A little help with bathing/dressing/bathroom;Assist for transportation;Help with stairs or ramp for entrance;Assistance with cooking/housework ? ?  ?Equipment Recommendations None recommended by PT  ?Recommendations for Other Services ?    ?  ?Functional Status Assessment Patient has had a recent decline in their functional status and  demonstrates the ability to make significant improvements in function in a reasonable and predictable amount of time.  ? ?  ?Precautions / Restrictions Precautions ?Precautions: None ?Restrictions ?Weight Bearing Restrictions: No  ? ?  ? ?Mobility ? Bed Mobility ?  ?  ?  ?  ?  ?  ?  ?General bed mobility comments: NT, pt in recliner ?  ? ?Transfers ?Overall transfer level: Needs assistance ?Equipment used: None ?Transfers: Sit to/from Stand ?Sit to Stand: Supervision ?  ?  ?  ?  ?  ?General transfer comment: Good eccentric and concentric control and stability ?  ? ?Ambulation/Gait ?Ambulation/Gait assistance: Supervision ?Gait Distance (Feet): 40 Feet x 1, 20 Feet x 1 ?Assistive device: None ?Gait Pattern/deviations: Step-through pattern, Decreased step length - left, Decreased step length - right ?Gait velocity: decreased ?  ?  ?General Gait Details: Slow cadence but steady without LOB with SpO2 97-98% at rest and dropping to a low of 94% during ambulation on 2.5L ? ?Stairs ?  ?  ?  ?  ?  ? ?Wheelchair Mobility ?  ? ?Modified Rankin (Stroke Patients Only) ?  ? ?  ? ?Balance Overall balance assessment: Needs assistance ?  ?Sitting balance-Leahy Scale: Normal ?  ?  ?Standing balance support: No upper extremity supported, During functional activity ?Standing balance-Leahy Scale: Good ?  ?  ?  ?  ?  ?  ?  ?  ?  ?  ?  ?  ?   ? ? ? ?Pertinent Vitals/Pain Pain Assessment ?Pain Assessment: No/denies pain  ? ? ?Home Living Family/patient expects to be discharged to:: Private residence ?Living Arrangements: Alone ?Available Help at Discharge: Family;Available PRN/intermittently ?Type of Home: House ?Home Access: Stairs to enter ?Entrance Stairs-Rails:  Right;Left (too wide for both) ?Entrance Stairs-Number of Steps: 3 ?  ?Home Layout: One level ?Home Equipment: Grab bars - tub/shower;Cane - single point;Winger (2 wheels) ?Additional Comments: Daughter can assist patient intermittently if needed  ?  ?Prior  Function   ?  ?  ?  ?  ?  ?  ?Mobility Comments: Ind amb community distances without an AD, no fall history, walks on a tread mill 1 mile/day ?ADLs Comments: Ind with ADLs ?  ? ? ?Hand Dominance  ?   ? ?  ?Extremity/Trunk Assessment  ? Upper Extremity Assessment ?Upper Extremity Assessment: Generalized weakness ?  ? ?Lower Extremity Assessment ?Lower Extremity Assessment: Generalized weakness ?  ? ?   ?Communication  ? Communication: No difficulties  ?Cognition Arousal/Alertness: Awake/alert ?Behavior During Therapy: [facility] for tasks assessed/performed ?Overall Cognitive Status: Within Functional Limits for tasks assessed ?  ?  ?  ?  ?  ?  ?  ?  ?  ?  ?  ?  ?  ?  ?  ?  ?  ?  ?  ? ?  ?General Comments   ? ?  ?Exercises    ? ?Assessment/Plan  ?  ?PT Assessment Patient needs continued PT services  ?PT Problem List Decreased strength;Decreased activity tolerance;Decreased balance ? ?   ?  ?PT Treatment Interventions DME instruction;Gait training;Stair training;Functional mobility training;Therapeutic activities;Therapeutic exercise;Balance training;Patient/family education   ? ?PT Goals (Current goals can be found in the Care Plan section)  ?Acute Rehab PT Goals ?Patient Stated Goal: Improved balance ?PT Goal Formulation: With patient ?Time For Goal Achievement: 04/19/22 ?Potential to Achieve Goals: Good ? ?  ?Frequency Min 2X/week ?  ? ? ?Co-evaluation   ?  ?  ?  ?  ? ? ?  ?AM-PAC PT "6 Clicks" Mobility  ?Outcome Measure Help needed turning from your back to your side while in a flat bed without using bedrails?: A Little ?Help needed moving from lying on your back to sitting on the side of a flat bed without using bedrails?: A Little ?Help needed moving to and from a bed to a chair (including a wheelchair)?: A Little ?Help needed standing up from a chair using your arms (e.g., wheelchair or bedside chair)?: A Little ?Help needed to walk in hospital room?: A Little ?Help needed climbing 3-5 steps with a railing? : A  Little ?6 Click Score: 18 ? ?  ?End of Session Equipment Utilized During Treatment: Gait belt;Oxygen ?Activity Tolerance: Patient tolerated treatment well ?Patient left: in chair;with call bell/phone within reach ?Nurse Communication: Mobility status;Other (comment) (SpO2 with amb) ?PT Visit Diagnosis: Unsteadiness on feet (R26.81);Difficulty in walking, not elsewhere classified (R26.2);Muscle weakness (generalized) (M62.81) ?  ? ?Time: [id number] ?PT Time Calculation (min) (ACUTE ONLY): 41 min ? ? ?Charges:   PT Evaluation ?$PT Eval Moderate Complexity: 1 Mod ?PT Treatments ?$Gait Training: 8-22 mins ?$Therapeutic Activity: 8-22 mins ?  ?   ? ? ?D. Royetta Asal PT, DPT ?04/06/22, 3:32 PM ? ? ?

## 2022-04-06 NOTE — Progress Notes (Signed)
Patient has a productive cough that has blood in it. Patient states that the physician is aware. Will mention to oncoming nurse to monitor for any changes because patient states doctor is already aware. Patient states this happens everyday. ? ?Dextromethorphan/ Mucinex PRN administered for Cough. Patient states he has some relief and his shortness of breath has improved a little. Current O2 sat is 97% on 2L [place] ?

## 2022-04-07 DIAGNOSIS — I5023 Acute on chronic systolic (congestive) heart failure: Secondary | ICD-10-CM | POA: Diagnosis not present

## 2022-04-07 DIAGNOSIS — U071 COVID-19: Secondary | ICD-10-CM | POA: Diagnosis not present

## 2022-04-07 DIAGNOSIS — J1282 Pneumonia due to coronavirus disease 2019: Secondary | ICD-10-CM | POA: Diagnosis not present

## 2022-04-07 DIAGNOSIS — I499 Cardiac arrhythmia, unspecified: Secondary | ICD-10-CM | POA: Diagnosis not present

## 2022-04-07 LAB — MAGNESIUM: Magnesium: 2.6 mg/dL — ABNORMAL HIGH (ref 1.7–2.4)

## 2022-04-07 LAB — BASIC METABOLIC PANEL
Anion gap: 9 (ref 5–15)
BUN: 43 mg/dL — ABNORMAL HIGH (ref 8–23)
CO2: 26 mmol/L (ref 22–32)
Calcium: 8.2 mg/dL — ABNORMAL LOW (ref 8.9–10.3)
Chloride: 98 mmol/L (ref 98–111)
Creatinine, Ser: 1.14 mg/dL (ref 0.61–1.24)
GFR, Estimated: >60 mL/min (ref >60)
Glucose, Bld: 121 mg/dL — ABNORMAL HIGH (ref 70–99)
Potassium: 4.1 mmol/L (ref 3.5–5.1)
Sodium: 133 mmol/L — ABNORMAL LOW (ref 135–145)

## 2022-04-07 NOTE — Consult Note (Signed)
? ?  Heart Failure Nurse Navigator Note ? ?HFrEF<20%.  Left ventricular internal cavity size is moderately to severely dilated.  Grade 3 diastolic dysfunction.  Moderate mitral regurgitation. ? ?He presented from home with complaints of cough and shortness of breath.  He had also noted orthopnea. ? ?Comorbidities: ? ?Hypertension ?Hyperlipidemia ?Ventricular tachycardia ? ?Medications: ? ?Aspirin 81 mg daily ?Metoprolol succinate 50 mg 2 times a day ?Sotalol 120 mg 2 times a day ? ?Lasix 40 mg IV daily has been discontinued. ? ?Labs: ? ?Sodium 133, potassium 4.1, chloride 98, CO2 26, BUN 43, creatinine 1.14, GFR greater than 60, magnesium 2.6, BNP 883 ?Weight is 68.7 kg ?Blood pressure 118/84 ? ?Initial meeting with patient today, he is lying in bed, states earlier that he did not feel quite right and called out to make his nurse aware.  He states that he is feeling somewhat better but not back to baseline but wanted to continue with the interview as I offered to come back later.  O2 saturations around 98-100 on 2 L of O2 with him talking.  Heart rates was 68-78. ? ?Discussed heart failure, and the fact that he has an ICD which is fired a total of 5 times.  He states at one time he had been on amiodarone but due to abnormal labs he was transitioned to sotalol. ? ? ?He states that he lives at home by himself, he has been a widower for 2-1/2 years.  He states that he does his own housework, preparing the meals and hires out having his yard work done.  He states that he also walks 1 mile daily on the treadmill.  He had noted prior to admission that it was getting harder and harder to complete that 1 mile daily. ? ?He does not use salt and weighs himself daily. ? ?He is compliant with all his medications. ? ?Discussed the outpatient heart failure clinic.  He states between following with his family doctor and with Dr.  Josefa Half he does not want to get established with the outpatient heart failure clinic.  Explained that  I would leave the information in this packet in case he changed his mind. ? ?He was given the living with heart failure teaching booklet, zone magnet, information on low-sodium and weight chart. ? ?He had no further questions. ? ?Pricilla Riffle RN CHFN ? ? ?

## 2022-04-07 NOTE — Progress Notes (Signed)
?  Progress Note ? ? ?Patient: Corey Zhang [id number] DOB: 06-13-1946 DOA: 04/03/2022     4 ?DOS: the patient was seen and examined on 04/07/2022 ?  ?Brief hospital course: ?DEVEYON NAZARI is a 76 y.o. 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 ?He developed hypoxemia, was placed on 2 L oxygen.  COVID test was positive.  Patient also has elevation BNP, chest x-ray showed left lower lobe infiltrates and interstitial pulm edema. ?Patient is treated with Paxlovid, Solu-Medrol as well as IV Lasix. ? ?Assessment and Plan: ?Acute hypoxemic respiratory failure secondary to pneumonia and congestive heart failure exacerbation. ?COVID-19 viral pneumonia. ?Patient condition continued to improve, currently on 1 L oxygen.  Continue Paxlovid and oral prednisone. ? ?Acute on chronic combined systolic and diastolic congestive heart failure. 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 ?Moderate mitral regurgitation. ?Echocardiogram performed today showed ejection fraction less than 20%, moderate mitral regurgitation with grade 3 diastolic dysfunction ?Followed by cardiology, condition improving.  Hold Lasix per recommendation from cardiology. ?  ?Essential hypertension. ?Continue metoprolol. ?  ? ? ?  ? ?Subjective:  ?Patient slept better last night, currently 95 L oxygen.  Was able to walk, but felt dizzy when he stands up the first time. ? ?Physical Exam: ?Vitals:  ? 04/07/22 0500 04/07/22 0720 04/07/22 0905 04/07/22 1200  ?BP: 119/72 110/84 115/83 118/84  ?Pulse: 84 77 74 76  ?Resp: 16 17 18 19   ?Temp: 98.5 ?F (36.9 ?C) 98.4 ?F (36.9 ?C) 97.8 ?F (36.6 ?C) 98 ?F (36.7 ?C)  ?TempSrc: Oral     ?SpO2: 97% 94% 95% 96%  ?Weight: 68.7 kg     ?Height:      ? ?General exam: Appears calm and comfortable  ?Respiratory system: Clear to auscultation. Respiratory effort normal. ?Cardiovascular  system: S1 & S2 heard, RRR. No JVD, murmurs, rubs, gallops or clicks. No pedal edema. ?Gastrointestinal system: Abdomen is nondistended, soft and nontender. No organomegaly or masses felt. Normal bowel sounds heard. ?Central nervous system: Alert and oriented. No focal neurological deficits. ?Extremities: Symmetric 5 x 5 power. ?Skin: No rashes, lesions or ulcers ?Psychiatry: Judgement and insight appear normal. Mood & affect appropriate.  ? ?Data Reviewed: ? ?Lab results reviewed ? ?Family Communication: not able to reach daughter ? ?Disposition: ?Status is: Inpatient ?Remains inpatient appropriate because: Severity of disease ? Planned Discharge Destination: Home with Home Health ? ? ? ?Time spent: 28 minutes ? ?Author: ?Sharen Hones, MD ?04/07/2022 1:35 PM ? ?For on call review [email or web address].  ?

## 2022-04-07 NOTE — Consult Note (Signed)
[facility] ? ?CARDIOLOGY CONSULT NOTE  ?Patient ID: ?BOW ADER ?MRN: [id number] ?DOB/AGE: [birth year] 76 y.o. ? ?Admit date: 04/03/2022 ?Referring Physician Sharen Hones ?Primary Physician Adin Hector, MD ?Primary Cardiologist Miquel Dunn ?Reason for Consultation AoCHF ? 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  Cardiology was consulted for evaluation of his heart failure. ? ?Interval history: ?- No acute events overnight.  ?- Breathing is slowly improving.  ?- No chest pain.  ? ? ?Review of systems complete and found to be negative unless listed above  ? ? ? ?Past Medical History:  ?Diagnosis Date  ? Cardiomyopathy ([place])   ? CHF (congestive heart failure) ([place])   ? Hyperlipidemia   ? Hypertension   ?  ?Past Surgical History:  ?Procedure Laterality Date  ? defribrillator implant    ?  ?Medications Prior to Admission  ?Medication Sig Dispense Refill Last Dose  ? acetaminophen (TYLENOL) 500 MG tablet Take 1,000 mg by mouth as needed.   04/02/2022  ? aspirin 81 MG EC tablet Take 81 mg by mouth daily.   04/02/2022  ? lisinopril (ZESTRIL) 5 MG tablet Take 5 mg by mouth daily.   04/02/2022  ? metoprolol succinate (TOPROL-XL) 100 MG 24 hr tablet Take 100 mg by mouth 2 (two) times daily.   04/03/2022  ? Multiple Vitamin (MULTI-VITAMIN) tablet Take 1 tablet by mouth daily.   04/02/2022  ? sotalol (BETAPACE) 120 MG tablet Take 120 mg by mouth 2 (two) times daily.   04/03/2022  ? ?Social History  ? ?Socioeconomic History  ? Marital status: Widowed  ?  Spouse name: Not on file  ? Number of children: Not on file  ? Years of education: Not on file  ? Highest education level: Not on file  ?Occupational History  ? Not on file  ?Tobacco Use  ? Smoking status: Never  ? Smokeless tobacco: Never  ?Vaping Use  ?  Vaping Use: Never used  ?Substance and Sexual Activity  ? Alcohol use: Not Currently  ? Drug use: Not Currently  ? Sexual activity: Not on file  ?Other Topics Concern  ? Not on file  ?Social History Narrative  ? Not on file  ? ?Social Determinants of Health  ? ?Financial Resource Strain: Not on file  ?Food Insecurity: Not on file  ?Transportation Needs: Not on file  ?Physical Activity: Not on file  ?Stress: Not on file  ?Social Connections: Not on file  ?Intimate Partner Violence: Not on file  ?  ?Family History  ?Problem Relation Age of Onset  ? Skin cancer Mother   ? Thyroid disease Mother   ? CAD Father   ? Stroke Father   ? Alcohol abuse Father   ? CAD Brother   ? Heart attack Brother   ? Hyperlipidemia Brother   ?  ? ? ?Review of systems complete and found to be negative unless listed above  ? ? ? ? ?PHYSICAL EXAM ? ?General: Well developed, well nourished, in no acute distress. [place] oxygen in place ?HEENT:  Normocephalic and atramatic ?Neck:  No JVD.  ?Lungs: Crackles in bl bases. ?Heart: HRRR . Normal S1 and S2 without gallops or murmurs.  ?Abdomen: Bowel sounds are positive, abdomen soft and non-tender  ?Msk:  Back normal, normal  gait. Normal strength and tone for age. ?Extremities: No clubbing, cyanosis or edema.   ?Neuro: Alert and oriented X 3. ?Psych:  Good affect, responds appropriately ? ?Labs: ?  ?Lab Results  ?Component Value Date  ? WBC 14.3 (H) 04/03/2022  ? HGB 14.4 04/03/2022  ? HCT 44.3 04/03/2022  ? MCV 96.5 04/03/2022  ? PLT 216 04/03/2022  ?  ?Recent Labs  ?Lab 04/03/22 ?1032 04/04/22 ?0530 04/07/22 ?[id number]  ?NA 136   < > 133*  ?K 4.0   < > 4.1  ?CL 107   < > 98  ?CO2 20*   < > 26  ?BUN 13   < > 43*  ?CREATININE 0.87   < > 1.14  ?CALCIUM 8.2*   < > 8.2*  ?PROT 6.7  --   --   ?BILITOT 1.3*  --   --   ?ALKPHOS 81  --   --   ?ALT 39  --   --   ?AST 40  --   --   ?GLUCOSE 110*   < > 121*  ? < > = values in this interval not displayed.  ? ? ?Lab Results  ?Component Value Date  ? CKTOTAL 125 12/31/2014   ? CKMB 15.0 (H) 01/01/2015  ? TROPONINI <0.03 03/04/2015  ? ?  ?Lab Results  ?Component Value Date  ? CHOL 165 01/01/2015  ? ?Lab Results  ?Component Value Date  ? HDL 25 (L) 01/01/2015  ? ?Lab Results  ?Component Value Date  ? LDLCALC 114 (H) 01/01/2015  ? ?Lab Results  ?Component Value Date  ? TRIG 132 01/01/2015  ? ?No results found for: CHOLHDL ?No results found for: LDLDIRECT  ?  ?Radiology: DG Chest 2 View ? ?Result Date: 04/03/2022 ?CLINICAL DATA:  A 76 year old male presents for evaluation of shortness of breath. EXAM: CHEST - 2 VIEW COMPARISON:  December 31, 2014. FINDINGS: Mild blunting of LEFT costodiaphragmatic sulcus may be due to scarring, no signs of effusion on lateral projection. Cardiac pacer defibrillator projects over the cardiac silhouette with power pack over the LEFT chest. Trachea midline. Cardiomediastinal contours and hilar structures are normal. Mild increased interstitial markings throughout the chest without signs of lobar consolidation but with airspace disease along the fissure likely in the LEFT chest and partially obscured LEFT hemidiaphragm along its medial aspect. On limited assessment there is no acute skeletal finding. IMPRESSION: LEFT lower lobe airspace disease and may represent atelectasis or infection. Mild increased interstitial markings could reflect pulmonary edema or atypical infection. Electronically Signed   By: Zetta Bills M.D.   On: 04/03/2022 10:06  ? ?ECHOCARDIOGRAM COMPLETE ? ?Result Date: 04/04/2022 ?   ECHOCARDIOGRAM REPORT   Patient Name:   Corey Zhang Date of Exam: 04/04/2022 Medical Rec #:  [id number]   Height:       68.0 in Accession #:    [id number]  Weight:       153.9 lb Date of Birth:  17-Mar-1946   BSA:          1.828 m? Patient Age:    73 years    BP:           107/70 mmHg Patient Gender: M           HR:           84 bpm. Exam Location:  [facility] Procedure: 2D Echo, Color Doppler and Cardiac Doppler Indications:     I50.21 congestive heart failure-Acute Systolic   History:  Patient has no prior history of Echocardiogram examinations.                  CHF; Risk Factors:Hypertension and Dyslipidemia. Pt tested                  positive for COVID-19 on 04/03/22.  Sonographer:     Charmayne Sheer Referring Phys:  Unknown Foley NIU Diagnosing Phys: Donnelly Angelica  Sonographer Comments: Suboptimal apical window and suboptimal subcostal window. Image acquisition challenging due to respiratory motion. IMPRESSIONS  1. Left ventricular ejection fraction, by estimation, is <20%. The left ventricle has severely decreased function. The left ventricle demonstrates global hypokinesis. The left ventricular internal cavity size was moderately to severely dilated. Left ventricular diastolic parameters are consistent with Grade III diastolic dysfunction (restrictive).  2. Right ventricular systolic function is normal. The right ventricular size is normal.  3. Left atrial size was mildly dilated.  4. The mitral valve is normal in structure. Moderate mitral valve regurgitation. No evidence of mitral stenosis.  5. The aortic valve is grossly normal. Aortic valve regurgitation is not visualized. No aortic stenosis is present.  6. The inferior vena cava is dilated in size with <50% respiratory variability, suggesting right atrial pressure of 15 mmHg. FINDINGS  Left Ventricle: Left ventricular ejection fraction, by estimation, is <20%. The left ventricle has severely decreased function. The left ventricle demonstrates global hypokinesis. The left ventricular internal cavity size was moderately to severely dilated. There is no left ventricular hypertrophy. Left ventricular diastolic parameters are consistent with Grade III diastolic dysfunction (restrictive). Right Ventricle: The right ventricular size is normal. Right vetricular wall thickness was not well visualized. Right ventricular systolic function is normal. Left Atrium: Left atrial size was mildly dilated. Right Atrium: Right atrial size was normal  in size. Pericardium: There is no evidence of pericardial effusion. Mitral Valve: The mitral valve is normal in structure. Moderate mitral valve regurgitation. No evidence of mitral valve stenosis. MV pe

## 2022-04-07 NOTE — Progress Notes (Signed)
Initial Nutrition Assessment ? ?DOCUMENTATION CODES:  ? ?Not applicable ? ?INTERVENTION:  ? ?-Ensure Enlive po TID, each supplement provides 350 kcal and 20 grams of protein ?-MVI with minerals daily ? ?NUTRITION DIAGNOSIS:  ? ?Increased nutrient needs related to acute illness (COVID-19) as evidenced by estimated needs. ? ?GOAL:  ? ?Patient will meet greater than or equal to 90% of their needs ? ?MONITOR:  ? ?PO intake, Supplement acceptance ? ?REASON FOR ASSESSMENT:  ? ?Rounds ?  ? ?ASSESSMENT:  ? 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 ? ?Pt admitted with COVID pneumonia and CHF exacerbation.  ? ?Reviewed I/O's: -1.9 L x 24 hours and -3.5 L since admission ? ?UOP: 2.6 L x 24 hours ? ?Spoke with pt at bedside, who reports feeling better today. He shares that he has had poor oral intake over the past week or so, however, intake is slowly improving. He reports that he is trying to eat, but feels like the hospital meals are too close together. He consumed all of his breakfast this morning. Noted meal completions 100%.  ? ?Per pt, he has had many interruptions and is afraid his food will be cold. Discussed importance of good meal and supplement intake to promote healing. Assisted pt with ordering his lunch and provided an Ensure, which he is amenable to continue.   ? ?Pt denies any weight loss. He reports he is diligent in weighing himself and able to tell this RD when to call MD for wt changes. Pt with moderate muscle depletion in his legs, but pt shares that this is normal for him.  ? ?Medications reviewed and include solu-medrol.  ? ?Labs reviewed: Na: 133, Mg: 2.6, CBGS: 144 (inpatient orders for glycemic control are none).   ? ?NUTRITION - FOCUSED PHYSICAL EXAM: ? ?Flowsheet Row Most Recent Value  ?Orbital Region No depletion  ?Upper Arm Region No depletion  ?Thoracic and Lumbar  Region No depletion  ?Buccal Region No depletion  ?Temple Region No depletion  ?Clavicle Bone Region No depletion  ?Clavicle and Acromion Bone Region No depletion  ?Scapular Bone Region No depletion  ?Dorsal Hand No depletion  ?Patellar Region Moderate depletion  ?Anterior Thigh Region Moderate depletion  ?Posterior Calf Region Moderate depletion  ?Edema (RD Assessment) None  ?Hair Reviewed  ?Eyes Reviewed  ?Mouth Reviewed  ?Skin Reviewed  ?Nails Reviewed  ? ?  ? ? ?Diet Order:   ?Diet Order   ? ?       ?  Diet 2 gram sodium Room service appropriate? Yes; Fluid consistency: Thin  Diet effective now       ?  ? ?  ?  ? ?  ? ? ?EDUCATION NEEDS:  ? ?Education needs have been addressed ? ?Skin:  Skin Assessment: Reviewed RN Assessment ? ?Last BM:  04/06/22 ? ?Height:  ? ?Ht Readings from Last 1 Encounters:  ?04/03/22 5\' 8"  (1.727 m)  ? ? ?Weight:  ? ?Wt Readings from Last 1 Encounters:  ?04/07/22 68.7 kg  ? ? ?Ideal Body Weight:  70 kg ? ?BMI:  Body mass index is 23.03 kg/m?. ? ?Estimated Nutritional Needs:  ? ?Kcal:  2050-2250 ? ?Protein:  100-115 grams ? ?Fluid:  > 2 L ? ? ? ?Loistine Chance, RD, LDN, CDCES ?Registered Dietitian II ?Certified Diabetes Care and Education Specialist ?Please refer to [facility] for RD and/or RD on-call/weekend/after hours pager  ?

## 2022-04-08 ENCOUNTER — Other Ambulatory Visit: Payer: Self-pay

## 2022-04-08 DIAGNOSIS — J1282 Pneumonia due to coronavirus disease 2019: Secondary | ICD-10-CM | POA: Diagnosis not present

## 2022-04-08 DIAGNOSIS — U071 COVID-19: Secondary | ICD-10-CM | POA: Diagnosis not present

## 2022-04-08 DIAGNOSIS — I499 Cardiac arrhythmia, unspecified: Secondary | ICD-10-CM | POA: Diagnosis not present

## 2022-04-08 DIAGNOSIS — I5023 Acute on chronic systolic (congestive) heart failure: Secondary | ICD-10-CM | POA: Diagnosis not present

## 2022-04-08 LAB — CULTURE, BLOOD (ROUTINE X 2)
Culture: NO GROWTH
Culture: NO GROWTH
Special Requests: ADEQUATE
Special Requests: ADEQUATE

## 2022-04-08 LAB — BASIC METABOLIC PANEL
Anion gap: 10 (ref 5–15)
BUN: 39 mg/dL — ABNORMAL HIGH (ref 8–23)
CO2: 24 mmol/L (ref 22–32)
Calcium: 8.6 mg/dL — ABNORMAL LOW (ref 8.9–10.3)
Chloride: 99 mmol/L (ref 98–111)
Creatinine, Ser: 1.04 mg/dL (ref 0.61–1.24)
GFR, Estimated: >60 mL/min (ref >60)
Glucose, Bld: 118 mg/dL — ABNORMAL HIGH (ref 70–99)
Potassium: 4.4 mmol/L (ref 3.5–5.1)
Sodium: 133 mmol/L — ABNORMAL LOW (ref 135–145)

## 2022-04-08 LAB — CBC
HCT: 46.8 % (ref 39.0–52.0)
Hemoglobin: 15.6 g/dL (ref 13.0–17.0)
MCH: 31.3 pg (ref 26.0–34.0)
MCHC: 33.3 g/dL (ref 30.0–36.0)
MCV: 94 fL (ref 80.0–100.0)
Platelets: 234 10*3/uL (ref 150–400)
RBC: 4.98 MIL/uL (ref 4.22–5.81)
RDW: 13.3 % (ref 11.5–15.5)
WBC: 24 10*3/uL — ABNORMAL HIGH (ref 4.0–10.5)
nRBC: 0 % (ref 0.0–0.2)

## 2022-04-08 LAB — PROCALCITONIN: Procalcitonin: <0.1 ng/mL

## 2022-04-08 LAB — LEGIONELLA PNEUMOPHILA SEROGP 1 UR AG: L. pneumophila Serogp 1 Ur Ag: NEGATIVE

## 2022-04-08 LAB — MAGNESIUM: Magnesium: 2.7 mg/dL — ABNORMAL HIGH (ref 1.7–2.4)

## 2022-04-08 MED ORDER — ALBUTEROL SULFATE HFA 108 (90 BASE) MCG/ACT IN AERS: 2.0000 | INHALATION_SPRAY | RESPIRATORY_TRACT | 0 refills | Status: AC | PRN

## 2022-04-08 MED ORDER — PREDNISONE 10 MG PO TABS: ORAL_TABLET | ORAL | 0 refills | Status: AC

## 2022-04-08 MED ORDER — FUROSEMIDE 40 MG PO TABS
40.0000 mg | ORAL_TABLET | Freq: Every day | ORAL | Status: DC
  Administered 2022-04-08: 40 mg via ORAL
  Filled 2022-04-08: qty 1

## 2022-04-08 MED ORDER — FUROSEMIDE 40 MG PO TABS: 20.0000 mg | ORAL_TABLET | Freq: Every day | ORAL | 0 refills | Status: AC

## 2022-04-08 MED ORDER — MELATONIN 5 MG PO TABS
5.0000 mg | ORAL_TABLET | Freq: Every evening | ORAL | Status: DC | PRN
  Administered 2022-04-08: 5 mg via ORAL
  Filled 2022-04-08: qty 1

## 2022-04-08 NOTE — Progress Notes (Signed)
Patient complains of a dull pain in chest and "heart racing". Auscultation, Vital signs taken on bilateral hands, and Vital signs taken manually are all normal. Nurse Practitioner informed. Did an EKG and it showed Left Anterior Block with PVCs. Bishop Limbo (NP) informed. ?

## 2022-04-08 NOTE — Progress Notes (Signed)
[facility] Cardiology ? ?CARDIOLOGY CONSULT NOTE  ?Patient ID: ?Corey Zhang ?MRN: [id number] ?DOB/AGE: 04/28/1946 76 y.o. ? ?Admit date: 04/03/2022 ?Referring Physician Sharen Hones ?Primary Physician Adin Hector, MD ?Primary Cardiologist Miquel Dunn ?Reason for Consultation AoCHF ? 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  Cardiology was consulted for evaluation of his heart failure. ? ?Interval history: ?- admits to some palpitations this morning, no chest pain. Hasn't slept well the past couple nights. Still has orthopnea, but thinks his breathing has improved a little. Eager to go home. ?- repeat EKG this morning showed QTC increasing 532 (was 486 on 5/5) ?- d/c paxlovid (only had one more dose remaining) and zofran  ?- increasing PVCs and NSVT (15 beats) on tele this morning ? ? ?Review of systems complete and found to be negative unless listed above  ? ? ?Past Medical History:  ?Diagnosis Date  ? Cardiomyopathy ([place])   ? CHF (congestive heart failure) ([place])   ? Hyperlipidemia   ? Hypertension   ?  ?Past Surgical History:  ?Procedure Laterality Date  ? defribrillator implant    ?  ?Medications Prior to Admission  ?Medication Sig Dispense Refill Last Dose  ? acetaminophen (TYLENOL) 500 MG tablet Take 1,000 mg by mouth as needed.   04/02/2022  ? aspirin 81 MG EC tablet Take 81 mg by mouth daily.   04/02/2022  ? lisinopril (ZESTRIL) 5 MG tablet Take 5 mg by mouth daily.   04/02/2022  ? metoprolol succinate (TOPROL-XL) 100 MG 24 hr tablet Take 100 mg by mouth 2 (two) times daily.   04/03/2022  ? Multiple Vitamin (MULTI-VITAMIN) tablet Take 1 tablet by mouth daily.   04/02/2022  ? sotalol (BETAPACE) 120 MG tablet Take 120 mg by mouth 2 (two) times daily.   04/03/2022  ? ?Social History  ? ?Socioeconomic  History  ? Marital status: Widowed  ?  Spouse name: Not on file  ? Number of children: Not on file  ? Years of education: Not on file  ? Highest education level: Not on file  ?Occupational History  ? Not on file  ?Tobacco Use  ? Smoking status: Never  ? Smokeless tobacco: Never  ?Vaping Use  ? Vaping Use: Never used  ?Substance and Sexual Activity  ? Alcohol use: Not Currently  ? Drug use: Not Currently  ? Sexual activity: Not on file  ?Other Topics Concern  ? Not on file  ?Social History Narrative  ? Not on file  ? ?Social Determinants of Health  ? ?Financial Resource Strain: Not on file  ?Food Insecurity: Not on file  ?Transportation Needs: Not on file  ?Physical Activity: Not on file  ?Stress: Not on file  ?Social Connections: Not on file  ?Intimate Partner Violence: Not on file  ?  ?Family History  ?Problem Relation Age of Onset  ? Skin cancer Mother   ? Thyroid disease Mother   ? CAD Father   ? Stroke Father   ? Alcohol abuse Father   ? CAD Brother   ? Heart attack Brother   ? Hyperlipidemia Brother   ?  ? ?PHYSICAL EXAM ? ?General: Pleasant elderly caucasian male. Well developed, well nourished, in no acute distress. Sitting upright in recliner, daughter at bedside.  ?HEENT:  Normocephalic and atraumatic ?Neck:  No JVD.  ?Lungs: Normal respiratory effort on 2L by [place]. Trace bibasilar crackles. ?Heart: HRRR . Normal S1 and S2 without gallops or murmurs.  ?Abdomen: nondistended appearing ?Msk: . Normal strength and tone for age. ?Extremities: No clubbing, cyanosis or edema.   ?Neuro: Alert and oriented X 3. ?Psych:  Good affect, responds appropriately ? ?Labs: ?  ?Lab Results  ?Component Value Date  ? WBC 24.0 (H) 04/08/2022  ? HGB 15.6 04/08/2022  ? HCT 46.8 04/08/2022  ? MCV 94.0 04/08/2022  ? PLT 234 04/08/2022  ?  ?Recent Labs  ?Lab 04/03/22 ?1032 04/04/22 ?0530 04/08/22 ?0844  ?NA 136   < > 133*  ?K 4.0   < > 4.4  ?CL 107   < > 99  ?CO2 20*   < > 24  ?BUN 13   < > 39*  ?CREATININE 0.87   < > 1.04  ?CALCIUM  8.2*   < > 8.6*  ?PROT 6.7  --   --   ?BILITOT 1.3*  --   --   ?ALKPHOS 81  --   --   ?ALT 39  --   --   ?AST 40  --   --   ?GLUCOSE 110*   < > 118*  ? < > = values in this interval not displayed.  ? ? ?Lab Results  ?Component Value Date  ? CKTOTAL 125 12/31/2014  ? CKMB 15.0 (H) 01/01/2015  ? TROPONINI <0.03 03/04/2015  ? ?  ?Lab Results  ?Component Value Date  ? CHOL 165 01/01/2015  ? ?Lab Results  ?Component Value Date  ? HDL 25 (L) 01/01/2015  ? ?Lab Results  ?Component Value Date  ? LDLCALC 114 (H) 01/01/2015  ? ?Lab Results  ?Component Value Date  ? TRIG 132 01/01/2015  ? ?No results found for: CHOLHDL ?No results found for: LDLDIRECT  ?  ?Radiology: DG Chest 2 View ? ?Result Date: 04/03/2022 ?CLINICAL DATA:  A 76 year old male presents for evaluation of shortness of breath. EXAM: CHEST - 2 VIEW COMPARISON:  December 31, 2014. FINDINGS: Mild blunting of LEFT costodiaphragmatic sulcus may be due to scarring, no signs of effusion on lateral projection. Cardiac pacer defibrillator projects over the cardiac silhouette with power pack over the LEFT chest. Trachea midline. Cardiomediastinal contours and hilar structures are normal. Mild increased interstitial markings throughout the chest without signs of lobar consolidation but with airspace disease along the fissure likely in the LEFT chest and partially obscured LEFT hemidiaphragm along its medial aspect. On limited assessment there is no acute skeletal finding. IMPRESSION: LEFT lower lobe airspace disease and may represent atelectasis or infection. Mild increased interstitial markings could reflect pulmonary edema or atypical infection. Electronically Signed   By: Zetta Bills M.D.   On: 04/03/2022 10:06  ? ?ECHOCARDIOGRAM COMPLETE ? ?Result Date: 04/04/2022 ?   ECHOCARDIOGRAM REPORT   Patient Name:   Corey Zhang Date of Exam: 04/04/2022 Medical Rec #:  [id number]   Height:       68.0 in Accession #:    [id number]  Weight:       153.9 lb Date of Birth:  23-Nov-1946    BSA:          1.828 m? Patient Age:    52 years    BP:           107/70 mmHg Patient Gender: M           HR:  84 bpm. Exam Location:  [facility] Procedure: 2D Echo, Color Doppler and Cardiac Doppler Indications:     I50.21 congestive heart failure-Acute Systolic  History:         Patient has no prior history of Echocardiogram examinations.                  CHF; Risk Factors:Hypertension and Dyslipidemia. Pt tested                  positive for COVID-19 on 04/03/22.  Sonographer:     Charmayne Sheer Referring Phys:  Unknown Foley NIU Diagnosing Phys: Donnelly Angelica  Sonographer Comments: Suboptimal apical window and suboptimal subcostal window. Image acquisition challenging due to respiratory motion. IMPRESSIONS  1. Left ventricular ejection fraction, by estimation, is <20%. The left ventricle has severely decreased function. The left ventricle demonstrates global hypokinesis. The left ventricular internal cavity size was moderately to severely dilated. Left ventricular diastolic parameters are consistent with Grade III diastolic dysfunction (restrictive).  2. Right ventricular systolic function is normal. The right ventricular size is normal.  3. Left atrial size was mildly dilated.  4. The mitral valve is normal in structure. Moderate mitral valve regurgitation. No evidence of mitral stenosis.  5. The aortic valve is grossly normal. Aortic valve regurgitation is not visualized. No aortic stenosis is present.  6. The inferior vena cava is dilated in size with <50% respiratory variability, suggesting right atrial pressure of 15 mmHg. FINDINGS  Left Ventricle: Left ventricular ejection fraction, by estimation, is <20%. The left ventricle has severely decreased function. The left ventricle demonstrates global hypokinesis. The left ventricular internal cavity size was moderately to severely dilated. There is no left ventricular hypertrophy. Left ventricular diastolic parameters are consistent with Grade III diastolic dysfunction  (restrictive). Right Ventricle: The right ventricular size is normal. Right vetricular wall thickness was not well visualized. Right ventricular systolic function is normal. Left Atrium: Left atrial size was

## 2022-04-08 NOTE — Progress Notes (Signed)
SATURATION QUALIFICATIONS: (This note is used to comply with regulatory documentation for home oxygen)  Patient Saturations on Room Air at Rest = 90%  Patient Saturations on Room Air while Ambulating = 84%  Patient Saturations on 2 Liters of oxygen while Ambulating = 90%  Please briefly explain why patient needs home oxygen: 

## 2022-04-08 NOTE — Discharge Summary (Addendum)
?Physician Discharge Summary ?  ?Patient: Corey Zhang MRN: [id number] DOB: Apr 16, 1946  ?Admit date:     04/03/2022  ?Discharge date: 04/08/22  ?Discharge Physician: Sharen Hones  ? ?PCP: Adin Hector, MD  ? ?Recommendations at discharge:  ? ?Follow-up with 1 PCP in 1 week. ?Follow-up with cardiology in 1 to 2 weeks ? ?Discharge Diagnoses: ?Principal Problem: ?  Pneumonia due to COVID-19 virus ?Active Problems: ?  Acute on chronic systolic CHF (congestive heart failure) ([place]) ?  Ventricular arrhythmia ?  Hypertension ?  Hyperlipidemia ?  GERD (gastroesophageal reflux disease) ? ?Resolved Problems: ?  * No resolved hospital problems. * ? ?Hospital Course: ?Corey Zhang is a 76 y.o. 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 ?He developed hypoxemia, was placed on 2 L oxygen.  COVID test was positive.  Patient also has elevation BNP, chest x-ray showed left lower lobe infiltrates and interstitial pulm edema. ?Patient is treated with Paxlovid, Solu-Medrol as well as IV Lasix. ? ?Assessment and Plan: ?Acute hypoxemic respiratory failure secondary to pneumonia and congestive heart failure exacerbation. ?COVID-19 viral pneumonia. ?Condition improved, short of breath is better.  Patient feel confused due to not sleeping at nighttime.  At this point, he will be discharged in a stable condition.  Continue steroid taper for additional 6 days.  Completed course of Paxlovid. ?Obtained home oxygen evaluation, oxygen saturation dropped to 84% with relation.  Oxygen prescribed. ? ?Acute on chronic combined systolic and diastolic congestive heart failure. 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 ?Moderate mitral regurgitation. ?Prolonged QT interval. ?Echocardiogram performed today showed ejection fraction less than 20%, moderate mitral regurgitation with grade 3 diastolic  dysfunction ?Condition improved, patient currently is euvolemic.  He will receive 40 mg oral Lasix, followed with 20 mg p.o. daily.  Follow-up with cardiology as outpatient ?  ?Essential hypertension. ?Continue metoprolol. ? ?Hyponatremia. ?Appear to be mild.  Follow-up with PCP as outpatient. ? ?Leukocytosis. ?Secondary to steroids, repeated procalcitonin level less than 0.1, no bacterial infection. ? ? ?  ? ? ?Consultants: Cardiology ?Procedures performed: None  ?Disposition: Home ?Diet recommendation:  ?Discharge Diet Orders (From admission, onward)  ? ?  Start     Ordered  ? 04/08/22 0000  Diet - low sodium heart healthy       ? 04/08/22 1414  ? ?  ?  ? ?  ? ?Cardiac diet ?DISCHARGE MEDICATION: ?Allergies as of 04/08/2022   ? ?   Reactions  ? Amiodarone Other (See Comments)  ? Hurts liver ?Liver and thyroid panels went up, also shaky  ? Other Swelling  ? Ant venom  ? ?  ? ?  ?Medication List  ?  ? ?TAKE these medications   ? ?acetaminophen 500 MG tablet ?Commonly known as: TYLENOL ?Take 1,000 mg by mouth as needed. ?  ?albuterol 108 (90 Base) MCG/ACT inhaler ?Commonly known as: VENTOLIN HFA ?Inhale 2 puffs into the lungs every 4 (four) hours as needed for wheezing or shortness of breath. ?  ?aspirin 81 MG EC tablet ?Take 81 mg by mouth daily. ?  ?furosemide 40 MG tablet ?Commonly known as: LASIX ?Take 0.5 tablets (20 mg total) by mouth daily. ?Start taking on: Apr 09, 2022 ?  ?lisinopril 5 MG tablet ?Commonly known as: ZESTRIL ?Take 5 mg by mouth daily. ?  ?metoprolol succinate 100 MG 24 hr tablet ?Commonly known as: TOPROL-XL ?  Take 100 mg by mouth 2 (two) times daily. ?  ?Multi-Vitamin tablet ?Take 1 tablet by mouth daily. ?  ?predniSONE 10 MG tablet ?Commonly known as: DELTASONE ?Take 2 tablets (20 mg total) by mouth daily for 3 days, THEN 1 tablet (10 mg total) daily for 3 days. ?Start taking on: Apr 08, 2022 ?  ?sotalol 120 MG tablet ?Commonly known as: BETAPACE ?Take 120 mg by mouth 2 (two) times daily. ?  ? ?   ? ?  ?  ? ? ?  ?Durable Medical Equipment  ?(From admission, onward)  ?  ? ? ?  ? ?  Start     Ordered  ? 04/08/22 1416  For home use only DME oxygen  Once       ?Question Answer Comment  ?Length of Need 6 Months   ?Mode or (Route) Nasal cannula   ?Liters per Minute 2   ?Oxygen conserving device Yes   ?Oxygen delivery system Gas   ?  ? 04/08/22 1415  ? ?  ?  ? ?  ? ? Follow-up Information   ? ? Paraschos, Alexander, MD. Go in 1 week(s).   ?Specialty: Cardiology ?Contact information: ?[facility] ?[place] [address] ?[phone] ? ? ?  ?  ? ? Tama High III, MD Follow up in 1 week(s).   ?Specialty: Internal Medicine ?Contact information: ?[facility] ?[place] [address] ?[phone] ? ? ?  ?  ? ?  ?  ? ?  ? ?Discharge Exam: ?Filed Weights  ? 04/05/22 0500 04/06/22 0500 04/07/22 0500  ?Weight: 68.7 kg 69.1 kg 68.7 kg  ? ?negativeGeneral exam: Appears calm and comfortable  ?Respiratory system: Clear to auscultation. Respiratory effort normal. ?Cardiovascular system: S1 & S2 heard, RRR. No JVD, murmurs, rubs, gallops or clicks. No pedal edema. ?Gastrointestinal system: Abdomen is nondistended, soft and nontender. No organomegaly or masses felt. Normal bowel sounds heard. ?Central nervous system: Alert and oriented. No focal neurological deficits. ?Extremities: Symmetric 5 x 5 power. ?Skin: No rashes, lesions or ulcers ?Psychiatry: Judgement and insight appear normal. Mood & affect appropriate.  ? ? ?Condition at discharge: good ? ?The results of significant diagnostics from this hospitalization (including imaging, microbiology, ancillary and laboratory) are listed below for reference.  ? ?Imaging Studies: ?DG Chest 2 View ? ?Result Date: 04/03/2022 ?CLINICAL DATA:  A 76 year old male presents for evaluation of shortness of breath. EXAM: CHEST - 2 VIEW COMPARISON:  December 31, 2014. FINDINGS: Mild blunting of LEFT costodiaphragmatic sulcus  may be due to scarring, no signs of effusion on lateral projection. Cardiac pacer defibrillator projects over the cardiac silhouette with power pack over the LEFT chest. Trachea midline. Cardiomediastinal contours and hilar structures are normal. Mild increased interstitial markings throughout the chest without signs of lobar consolidation but with airspace disease along the fissure likely in the LEFT chest and partially obscured LEFT hemidiaphragm along its medial aspect. On limited assessment there is no acute skeletal finding. IMPRESSION: LEFT lower lobe airspace disease and may represent atelectasis or infection. Mild increased interstitial markings could reflect pulmonary edema or atypical infection. Electronically Signed   By: Zetta Bills M.D.   On: 04/03/2022 10:06  ? ?ECHOCARDIOGRAM COMPLETE ? ?Result Date: 04/04/2022 ?   ECHOCARDIOGRAM REPORT   Patient Name:   Corey Zhang Date of Exam: 04/04/2022 Medical Rec #:  [id number]   Height:       68.0 in Accession #:  [id number]  Weight:       153.9 lb Date of Birth:  04-30-1946   BSA:          1.828 m? Patient Age:    74 years    BP:           107/70 mmHg Patient Gender: M           HR:           84 bpm. Exam Location:  [facility] Procedure: 2D Echo, Color Doppler and Cardiac Doppler Indications:     I50.21 congestive heart failure-Acute Systolic  History:         Patient has no prior history of Echocardiogram examinations.                  CHF; Risk Factors:Hypertension and Dyslipidemia. Pt tested                  positive for COVID-19 on 04/03/22.  Sonographer:     Charmayne Sheer Referring Phys:  Unknown Foley NIU Diagnosing Phys: Donnelly Angelica  Sonographer Comments: Suboptimal apical window and suboptimal subcostal window. Image acquisition challenging due to respiratory motion. IMPRESSIONS  1. Left ventricular ejection fraction, by estimation, is <20%. The left ventricle has severely decreased function. The left ventricle demonstrates global hypokinesis. The left ventricular  internal cavity size was moderately to severely dilated. Left ventricular diastolic parameters are consistent with Grade III diastolic dysfunction (restrictive).  2. Right ventricular systolic function is normal

## 2022-04-08 NOTE — Progress Notes (Signed)
Discharge instructions provided to patient and daughter. All medications, follow up appointments, and discharge instructions reviewed. IV out. Monitor off CCMD notified. Discharging to home with family.  ?Sabra Heck, RN  ?

## 2022-04-08 NOTE — TOC Transition Note (Addendum)
Transition of Care (TOC) - CM/SW Discharge Note ? ? ?Patient Details  ?Name: Corey Zhang ?MRN: [id number] ?Date of Birth: 1946-07-28 ? ?Transition of Care (TOC) CM/SW Contact:  ?Truddie Hidden, RN ?Phone Number: ?04/08/2022, 2:57 PM ? ? ?Clinical Narrative:    ? Spoke with patient and his daughter [name] by phone. ?Patient is from home alone. Admitted for pneumonia related to COVID. Will require home oxygen and HH PT. Contacted [facility] for referral. Referral accepted by [employer]. Patient advised [facility] will come see him for start of service 04/11/2022. [employer] contacted for new home oxygen supplies to be delivered to room. TOC signing off ? ?  ?  ? ? ?Patient Goals and CMS Choice ?  ?  ?  ? ?Discharge Placement ?  ?           ?  ?  ?  ?  ? ?Discharge Plan and Services ?  ?  ?           ?  ?  ?  ?  ?  ?  ?  ?  ?  ?  ? ?Social Determinants of Health (SDOH) Interventions ?  ? ? ?Readmission Risk Interventions ?   ? View : No data to display.  ?  ?  ?  ? ? ? ? ? ?

## 2022-04-08 NOTE — Progress Notes (Signed)
? ?      CROSS COVER NOTE ? ?NAME: Corey Zhang ?MRN: [id number] ?DOB : 05-10-1946 ? ? ?Secure chat received from nursing reporting patient experiencing palpitations with HR of 79 BP 119/85. EKG ordered and shows PVCs. K 4.1, Ca 8.2, and Mg 2.6 this morning. Continue with ordered Metoprolol XL 50 mg. ? ? ?Neomia Glass MHA, MSN, FNP-BC ?Nurse Practitioner ?[facility] ?[place] ?Pager [phone] ? ? ?

## 2022-04-17 ENCOUNTER — Ambulatory Visit: Payer: Medicare Other | Admitting: Family

## 2022-05-01 DEATH — deceased
